# Patient Record
Sex: Female | Born: 1950 | Race: White | Hispanic: No | Marital: Married | Smoking: Former smoker
Health system: Southern US, Community
[De-identification: ages and names within clinical notes are randomized; demographics above are authoritative.]

## PROBLEM LIST (undated history)

## (undated) DIAGNOSIS — K219 Gastro-esophageal reflux disease without esophagitis: Secondary | ICD-10-CM

## (undated) DIAGNOSIS — G43109 Migraine with aura, not intractable, without status migrainosus: Secondary | ICD-10-CM

## (undated) DIAGNOSIS — J45909 Unspecified asthma, uncomplicated: Secondary | ICD-10-CM

## (undated) DIAGNOSIS — M810 Age-related osteoporosis without current pathological fracture: Secondary | ICD-10-CM

## (undated) DIAGNOSIS — M1990 Unspecified osteoarthritis, unspecified site: Secondary | ICD-10-CM

## (undated) DIAGNOSIS — D219 Benign neoplasm of connective and other soft tissue, unspecified: Secondary | ICD-10-CM

## (undated) DIAGNOSIS — M79641 Pain in right hand: Secondary | ICD-10-CM

## (undated) DIAGNOSIS — E559 Vitamin D deficiency, unspecified: Secondary | ICD-10-CM

## (undated) DIAGNOSIS — E785 Hyperlipidemia, unspecified: Secondary | ICD-10-CM

## (undated) DIAGNOSIS — Z87442 Personal history of urinary calculi: Secondary | ICD-10-CM

## (undated) HISTORY — PX: DILATION AND CURETTAGE OF UTERUS: SHX78

## (undated) HISTORY — DX: Unspecified osteoarthritis, unspecified site: M19.90

## (undated) HISTORY — DX: Age-related osteoporosis without current pathological fracture: M81.0

## (undated) HISTORY — DX: Hyperlipidemia, unspecified: E78.5

## (undated) HISTORY — DX: Benign neoplasm of connective and other soft tissue, unspecified: D21.9

## (undated) HISTORY — PX: CATARACT EXTRACTION: SUR2

## (undated) HISTORY — PX: BIOPSY BREAST: PRO8

## (undated) HISTORY — DX: Pain in right hand: M79.641

## (undated) HISTORY — DX: Vitamin D deficiency, unspecified: E55.9

## (undated) HISTORY — PX: OTHER SURGICAL HISTORY: SHX169

## (undated) HISTORY — DX: Gastro-esophageal reflux disease without esophagitis: K21.9

## (undated) HISTORY — DX: Unspecified asthma, uncomplicated: J45.909

## (undated) HISTORY — DX: Migraine with aura, not intractable, without status migrainosus: G43.109

## (undated) HISTORY — DX: Personal history of urinary calculi: Z87.442

---

## 1998-02-26 ENCOUNTER — Other Ambulatory Visit: Admission: RE | Admit: 1998-02-26 | Discharge: 1998-02-26 | Payer: Self-pay | Admitting: Obstetrics and Gynecology

## 1998-03-20 ENCOUNTER — Encounter: Admission: RE | Admit: 1998-03-20 | Discharge: 1998-06-18 | Payer: Self-pay | Admitting: Orthopedic Surgery

## 1998-05-01 ENCOUNTER — Other Ambulatory Visit: Admission: RE | Admit: 1998-05-01 | Discharge: 1998-05-01 | Payer: Self-pay | Admitting: Obstetrics and Gynecology

## 1999-01-27 ENCOUNTER — Emergency Department (HOSPITAL_COMMUNITY): Admission: EM | Admit: 1999-01-27 | Discharge: 1999-01-27 | Payer: Self-pay | Admitting: Endocrinology

## 2000-01-19 ENCOUNTER — Other Ambulatory Visit: Admission: RE | Admit: 2000-01-19 | Discharge: 2000-01-19 | Payer: Self-pay | Admitting: Obstetrics and Gynecology

## 2002-03-09 ENCOUNTER — Other Ambulatory Visit: Admission: RE | Admit: 2002-03-09 | Discharge: 2002-03-09 | Payer: Self-pay | Admitting: Gynecology

## 2004-03-06 ENCOUNTER — Other Ambulatory Visit: Admission: RE | Admit: 2004-03-06 | Discharge: 2004-03-06 | Payer: Self-pay | Admitting: Obstetrics and Gynecology

## 2005-03-09 ENCOUNTER — Other Ambulatory Visit: Admission: RE | Admit: 2005-03-09 | Discharge: 2005-03-09 | Payer: Self-pay | Admitting: Obstetrics and Gynecology

## 2006-04-05 ENCOUNTER — Other Ambulatory Visit: Admission: RE | Admit: 2006-04-05 | Discharge: 2006-04-05 | Payer: Self-pay | Admitting: Obstetrics and Gynecology

## 2006-04-22 ENCOUNTER — Encounter (INDEPENDENT_AMBULATORY_CARE_PROVIDER_SITE_OTHER): Payer: Self-pay | Admitting: *Deleted

## 2006-04-22 ENCOUNTER — Ambulatory Visit (HOSPITAL_BASED_OUTPATIENT_CLINIC_OR_DEPARTMENT_OTHER): Admission: RE | Admit: 2006-04-22 | Discharge: 2006-04-22 | Payer: Self-pay | Admitting: Obstetrics and Gynecology

## 2009-06-24 ENCOUNTER — Other Ambulatory Visit: Admission: RE | Admit: 2009-06-24 | Discharge: 2009-06-24 | Payer: Self-pay | Admitting: Family Medicine

## 2011-03-20 NOTE — Op Note (Signed)
Denise Walsh, Denise Walsh                 ACCOUNT NO.:  [id number]   MEDICAL RECORD NO.:  [id number]          PATIENT TYPE:  AMB   LOCATION:  [facility]                         FACILITY:  [facility]   PHYSICIAN:  Daniel L. Gottsegen, M.D.DATE OF BIRTH:  1951/08/27   DATE OF PROCEDURE:  04/22/2006  DATE OF DISCHARGE:                                 OPERATIVE REPORT   PREOPERATIVE DIAGNOSES:  1.  Postmenopausal bleeding.  2.  Endometrial polyps.   POSTOPERATIVE DIAGNOSES:  1.  Postmenopausal bleeding.  2.  Endometrial polyps.   OPERATIONS:  Hysteroscopy with excision of endometrial polyp.   SURGEON:  Dr. Eda Paschal.   ANESTHESIA:  General endotracheal.   INDICATIONS:  The patient is a 60 year old postmenopausal female not on HRT  who presented to the office with postmenopausal bleeding.  Saline infusion  histogram showed 2 intrauterine cavity defects consistent with two fairly  large endometrial polyps.  She now enters the hospital for hysteroscopy,  excision of endometrial polyps and any other pathology found.   FINDINGS:  External is normal.  BUS is normal.  Vagina is normal.  Cervix is  clean.  Uterus is retroverted, normal size and shape with first-degree  uterine descensus.  Adnexa failed to reveal masses.  At the time  hysteroscopy, the patient had 2 large endometrial polyps, one was coming off  the lower uterine segment on the left side, one was coming off the top of  the fundus; both were between 1-2 cm.  Once they had been removed, the top  of the fundus, tubal ostia, anterior and posterior walls of the fundus,  lower uterine segment, and endocervical canal were free of disease.   PROCEDURE:  After adequate general anesthesia the patient was placed in the  dorsal supine position, and prepped and draped in the usual sterile manner.  A single-tooth tenaculum was placed on the anterior lip of the cervix.  The  cervix was dilated to a #33 Pratt dilator.  The hysteroscopic resectoscope  was  then introduced.  Sorbitol 3% was used to expand the intrauterine  cavity, and the camera was used for magnification.  A 90-degree wire loop  with settings for the coag and cutting that were appropriate was utilized.  Both polyps could be excised in pieces, and all polypoid tissue was sent to  pathology for tissue diagnosis.  There was absolutely no bleeding when the  procedure was terminated.   BLOOD LOSS:  Minimal.   FLUID DEFICIT:  100 mL.   The patient tolerated the procedure well and left the operating in  satisfactory condition.      Daniel L. Eda Paschal, M.D.  Electronically Signed     DLG/MEDQ  D:  04/22/2006  T:  04/22/2006  Job:  [id number]

## 2011-05-28 ENCOUNTER — Other Ambulatory Visit: Payer: Self-pay | Admitting: Family Medicine

## 2011-05-28 ENCOUNTER — Other Ambulatory Visit (HOSPITAL_COMMUNITY)
Admission: RE | Admit: 2011-05-28 | Discharge: 2011-05-28 | Disposition: A | Discharge status: Home / Self Care | Payer: BC Managed Care – PPO | Source: Ambulatory Visit | Attending: Family Medicine | Admitting: Family Medicine

## 2011-05-28 DIAGNOSIS — Z1159 Encounter for screening for other viral diseases: Secondary | ICD-10-CM | POA: Insufficient documentation

## 2011-05-28 DIAGNOSIS — Z124 Encounter for screening for malignant neoplasm of cervix: Secondary | ICD-10-CM | POA: Insufficient documentation

## 2012-07-29 ENCOUNTER — Other Ambulatory Visit: Payer: Self-pay | Admitting: Family Medicine

## 2012-07-29 DIAGNOSIS — R87610 Atypical squamous cells of undetermined significance on cytologic smear of cervix (ASC-US): Secondary | ICD-10-CM | POA: Insufficient documentation

## 2012-08-01 ENCOUNTER — Other Ambulatory Visit (HOSPITAL_COMMUNITY)
Admission: RE | Admit: 2012-08-01 | Discharge: 2012-08-01 | Disposition: A | Discharge status: Home / Self Care | Payer: BC Managed Care – PPO | Source: Ambulatory Visit | Attending: Family Medicine | Admitting: Family Medicine

## 2015-04-26 ENCOUNTER — Other Ambulatory Visit (HOSPITAL_COMMUNITY)
Admission: RE | Admit: 2015-04-26 | Discharge: 2015-04-26 | Disposition: A | Discharge status: Home / Self Care | Payer: BLUE CROSS/BLUE SHIELD | Source: Ambulatory Visit | Attending: Family Medicine | Admitting: Family Medicine

## 2015-04-26 ENCOUNTER — Other Ambulatory Visit: Payer: Self-pay | Admitting: Physician Assistant

## 2015-04-26 DIAGNOSIS — Z124 Encounter for screening for malignant neoplasm of cervix: Secondary | ICD-10-CM | POA: Diagnosis present

## 2015-04-30 LAB — CYTOLOGY - PAP

## 2018-02-16 DIAGNOSIS — Z1231 Encounter for screening mammogram for malignant neoplasm of breast: Secondary | ICD-10-CM | POA: Diagnosis not present

## 2018-02-24 DIAGNOSIS — L658 Other specified nonscarring hair loss: Secondary | ICD-10-CM | POA: Diagnosis not present

## 2018-02-24 DIAGNOSIS — L814 Other melanin hyperpigmentation: Secondary | ICD-10-CM | POA: Diagnosis not present

## 2018-02-24 DIAGNOSIS — D1721 Benign lipomatous neoplasm of skin and subcutaneous tissue of right arm: Secondary | ICD-10-CM | POA: Diagnosis not present

## 2018-02-24 DIAGNOSIS — L650 Telogen effluvium: Secondary | ICD-10-CM | POA: Diagnosis not present

## 2018-04-25 DIAGNOSIS — S83004A Unspecified dislocation of right patella, initial encounter: Secondary | ICD-10-CM | POA: Diagnosis not present

## 2018-05-10 ENCOUNTER — Other Ambulatory Visit: Payer: Self-pay

## 2018-05-10 ENCOUNTER — Ambulatory Visit: Payer: Medicare Other | Attending: Family Medicine | Admitting: Physical Therapy

## 2018-05-10 DIAGNOSIS — M6281 Muscle weakness (generalized): Secondary | ICD-10-CM | POA: Diagnosis not present

## 2018-05-10 DIAGNOSIS — R600 Localized edema: Secondary | ICD-10-CM | POA: Diagnosis not present

## 2018-05-10 NOTE — Therapy (Signed)
[address] [place], [place], [address] Phone: [phone]   Fax:  [phone]  Physical Therapy Evaluation  Patient Details  Name: Denise Walsh MRN: [id number] Date of Birth: 11-25-1950 Referring Provider: Cari Caraway   Encounter Date: 05/10/2018  PT End of Session - 05/10/18 0847    Visit Number  1    Number of Visits  16    Date for PT Re-Evaluation  07/05/18    PT Start Time  0847    PT Stop Time  0930    PT Time Calculation (min)  43 min    Activity Tolerance  Patient tolerated treatment well    Behavior During Therapy  [facility] for tasks assessed/performed       Past Medical History:  Diagnosis Date  . Asthma   . Osteoporosis     History reviewed. No pertinent surgical history.  There were no vitals filed for this visit.   Subjective Assessment - 05/10/18 0855    Subjective  Patient rolled her R ankle when steppiing out of bed and her knee locked in a bent position. R patella had dislocated. Her MD gave her a brace and she is here at PT for strengthening. Now knee clicks occasionally and feels "tricky".     Pertinent History  asthma    Patient Stated Goals  to strengthen    Currently in Pain?  No/denies         [facility] PT Assessment - 05/10/18 0001      Assessment   Medical Diagnosis  R patella dislocation    Referring Provider  Cari Caraway    Onset Date/Surgical Date  04/22/18      Precautions   Required Braces or Orthoses  Other Brace/Splint wears during the day when active      Balance Screen   Has the patient fallen in the past 6 months  No    Has the patient had a decrease in activity level because of a fear of falling?   No    Is the patient reluctant to leave their home because of a fear of falling?   No      Home Film/video editor residence    Living Arrangements  Spouse/significant other    Additional Comments  2 story home no problem with  stairs      Prior Function   Level of Independence  Independent    Vocation  Full time employment    Vocation Requirements  sitting      Observation/Other Assessments   Focus on Therapeutic Outcomes (FOTO)   48%      Functional Tests   Functional tests  Sit to Stand;Squat;Lunges;Single leg stance      Squat   Comments  unable to perform functional squat      Lunges   Comments  able but weak bil      Single Leg Stance   Comments  5 sec on RLE and unstable; L 7 sec more stable      Sit to Stand   Comments  12 sec 5x      ROM / Strength   AROM / PROM / Strength  AROM;Strength      AROM   Overall AROM Comments  R knee 0 to 135 deg;       Strength   Overall Strength Comments  R hip flex/abd/ext 4+/5    Strength Assessment Site  Knee;Ankle  Right/Left Knee  Right;Left    Right Knee Flexion  4/5    Right Knee Extension  4+/5    Left Knee Flexion  4+/5    Left Knee Extension  5/5    Right/Left Ankle  Right;Left    Right Ankle Dorsiflexion  5/5    Right Ankle Plantar Flexion  4+/5    Right Ankle Inversion  5/5    Right Ankle Eversion  5/5      Flexibility   Soft Tissue Assessment /Muscle Length  yes    Hamstrings  Bil HS tightness marked    Quadriceps  bil moderate    ITB  mild R    Piriformis  mild R      Palpation   Patella mobility  hyper mobile bil    Palpation comment  tender in R ITB, lateral quads and HS, R gastroc; R gluteals and piriformis; R medial joint line                Objective measurements completed on examination: See above findings.              PT Education - 05/10/18 0934    Education Details  HEP    Person(s) Educated  Patient    Methods  Explanation;Demonstration;Handout    Comprehension  Returned demonstration;Verbalized understanding       PT Short Term Goals - 05/10/18 0944      PT SHORT TERM GOAL #1   Title  I with inial HEP     Time  2    Period  Weeks    Status  New    Target Date  05/24/18      PT  SHORT TERM GOAL #2   Title  Patient able to drive without discomfort in R knee.    Time  4    Period  Weeks    Status  New    Target Date  06/07/18        PT Long Term Goals - 05/10/18 0946      PT LONG TERM GOAL #1   Title  Patient able to perform functional squats and lunges with full ROM and strength.    Time  8    Period  Weeks    Status  New    Target Date  07/05/18      PT LONG TERM GOAL #2   Title  Patient able to transfer from floor to standing without difficulty.    Time  8    Period  Weeks    Status  New      PT LONG TERM GOAL #3   Title  Improved SLS time to 10 sec on the right to improve balance.    Time  8    Period  Weeks    Status  New      PT LONG TERM GOAL #4   Title  Patient to report no incidence of disclocation in R knee.    Time  8    Period  Weeks    Status  New      PT LONG TERM GOAL #5   Title  patient to demo 5/5 R knee and hip strength to improve function.    Time  8    Period  Weeks    Status  New             Plan - 05/10/18 0934    Clinical Impression Statement  Patient presents for low complexity  evaluation s/p R patella dislocation. She has functional weakness in bil knees, hips and ankles, flexibility deficits and edema along her R Medial joint line. She has some discomfort with palpation at MJL. She reports difficulty with sit to stand transfers and driving 15 min or more and also has difficulty with SLS. She will benefit from PT to address these deficits.    Clinical Presentation  Stable    Clinical Decision Making  Low    Rehab Potential  Excellent    PT Frequency  2x / week    PT Duration  8 weeks    PT Treatment/Interventions  ADLs/Self Care Home Management;Iontophoresis 4mg /ml Dexamethasone;Electrical Stimulation;Cryotherapy;Ultrasound;Therapeutic exercise;Balance training;Neuromuscular re-education;Patient/family education;Manual techniques;Dry needling;Taping;Vasopneumatic Device    PT Next Visit Plan  LE strengthening;  functional activities including squats/lunges; balance/SLS    PT Home Exercise Plan  stretches: quads, ITB, HS, piriformis    Consulted and Agree with Plan of Care  Patient       Patient will benefit from skilled therapeutic intervention in order to improve the following deficits and impairments:  Decreased strength, Decreased balance, Impaired flexibility, Pain  Visit Diagnosis: Muscle weakness (generalized) - Plan: PT plan of care cert/re-cert  Localized edema - Plan: PT plan of care cert/re-cert     Problem List There are no active problems to display for this patient.   Denise Walsh PT 05/10/2018, 9:56 AM  [address] [place], [place], [address] Phone: [phone]   Fax:  [phone]  Name: Denise Walsh MRN: [id number] Date of Birth: 08-Aug-1951

## 2018-05-10 NOTE — Patient Instructions (Signed)
  Hamstring Stretch, Reclined (Strap, Doorframe)   Lengthen bottom leg on floor. Extend top leg along edge of doorframe or press foot up into yoga strap. Hold for 30-60  seconds. Repeat 3_ times each leg.  Piriformis (Supine)  Cross legs, right on top. Gently pull other knee toward chest until stretch is felt in buttock/hip of top leg. Hold _30___ seconds. Repeat _3___ times per set. Do ____ sets per session. Do __2__ sessions per day.   Quadriceps (Prone)   On stomach with sheet around ankles, knees together, hips down, pull heels toward bottom. Keep hips flat. Hold __30__ seconds. Repeat _3__ times. Do __3__ sessions per day. CAUTION: Stretch should be gentle, steady and slow.  Copyright  VHI. All rights reserved.    Outer Hip Stretch: Reclined IT Band Stretch (Strap)   Strap around opposite foot, pull across only as far as possible with shoulders on mat. Hold for __30__ seconds. Repeat __3__ times each leg. 2-3 x/day.  Copyright  VHI. All rights reserved.    Madelyn Flavors, PT 05/10/18 9:29 AM; [address] [place], [place], [address] Phone: [phone]   Fax:  [phone]

## 2018-05-12 ENCOUNTER — Encounter: Payer: Self-pay | Admitting: Physical Therapy

## 2018-05-12 ENCOUNTER — Ambulatory Visit: Payer: Medicare Other | Admitting: Physical Therapy

## 2018-05-12 DIAGNOSIS — R600 Localized edema: Secondary | ICD-10-CM | POA: Diagnosis not present

## 2018-05-12 DIAGNOSIS — M6281 Muscle weakness (generalized): Secondary | ICD-10-CM

## 2018-05-12 NOTE — Therapy (Signed)
[address] [place], [place], [address] Phone: [phone]   Fax:  [phone]  Physical Therapy Treatment  Patient Details  Name: Denise Walsh MRN: [id number] Date of Birth: 11-07-1950 Referring Provider: Cari Caraway   Encounter Date: 05/12/2018  PT End of Session - 05/12/18 1055    Visit Number  2    Date for PT Re-Evaluation  07/05/18    PT Start Time  1014    PT Stop Time  1104    PT Time Calculation (min)  50 min    Activity Tolerance  Patient tolerated treatment well    Behavior During Therapy  [facility] for tasks assessed/performed       Past Medical History:  Diagnosis Date  . Asthma   . Osteoporosis     History reviewed. No pertinent surgical history.  There were no vitals filed for this visit.  Subjective Assessment - 05/12/18 1016    Subjective  "Been doing fine, been doing the stretches"    Currently in Pain?  No/denies                       [facility] Adult PT Treatment/Exercise - 05/12/18 0001      Exercises   Exercises  Knee/Hip      Knee/Hip Exercises: Stretches   Passive Hamstring Stretch  Both;4 reps;10 seconds    Gastroc Stretch  Both;4 reps;20 seconds      Knee/Hip Exercises: Aerobic   Nustep  L4 x6 min       Knee/Hip Exercises: Standing   Forward Step Up  Right;1 set;10 reps;Hand Hold: 1;Step Height: 6"      Knee/Hip Exercises: Seated   Long Arc Quad  2 sets;10 reps;Weights    Long Arc Quad Weight  2 lbs.    Sit to Sand  2 sets;10 reps;without UE support 2nd set with ball squeeze      Knee/Hip Exercises: Supine   Short Arc Quad Sets  Right;2 sets;10 reps    Short Arc Quad Sets Limitations  2lb    Bridges with [employer]  Both;1 set;15 reps    Straight Leg Raises  1 set;Right;10 reps    Straight Leg Raise with External Rotation  Right;1 set;10 reps      Modalities   Modalities  Cryotherapy      Cryotherapy   Number Minutes Cryotherapy  10  Minutes    Cryotherapy Location  Knee    Type of Cryotherapy  Ice pack               PT Short Term Goals - 05/12/18 1058      PT SHORT TERM GOAL #1   Title  I with inial HEP     Status  Achieved      PT SHORT TERM GOAL #2   Title  Patient able to drive without discomfort in R knee.    Status  Partially Met        PT Long Term Goals - 05/10/18 0946      PT LONG TERM GOAL #1   Title  Patient able to perform functional squats and lunges with full ROM and strength.    Time  8    Period  Weeks    Status  New    Target Date  07/05/18      PT LONG TERM GOAL #2   Title  Patient able to transfer from floor to  standing without difficulty.    Time  8    Period  Weeks    Status  New      PT LONG TERM GOAL #3   Title  Improved SLS time to 10 sec on the right to improve balance.    Time  8    Period  Weeks    Status  New      PT LONG TERM GOAL #4   Title  Patient to report no incidence of disclocation in R knee.    Time  8    Period  Weeks    Status  New      PT LONG TERM GOAL #5   Title  patient to demo 5/5 R knee and hip strength to improve function.    Time  8    Period  Weeks    Status  New            Plan - 05/12/18 1056    Clinical Impression Statement  STG met. Patient tolerated an initial progression to TE well. She does report some clinking with SAQ. Pt reports no recent issues. Fatigues quick with bridging interventions. Tight Bilat HS and gast roc noted with stretching. Cues to push down through RLE with step ups.     Rehab Potential  Excellent    PT Frequency  2x / week    PT Duration  8 weeks    PT Treatment/Interventions  ADLs/Self Care Home Management;Iontophoresis 4mg/ml Dexamethasone;Electrical Stimulation;Cryotherapy;Ultrasound;Therapeutic exercise;Balance training;Neuromuscular re-education;Patient/family education;Manual techniques;Dry needling;Taping;Vasopneumatic Device    PT Next Visit Plan  LE strengthening; functional activities  including squats/lunges; balance/SLS       Patient will benefit from skilled therapeutic intervention in order to improve the following deficits and impairments:  Decreased strength, Decreased balance, Impaired flexibility, Pain  Visit Diagnosis: Muscle weakness (generalized)  Localized edema     Problem List There are no active problems to display for this patient.   Ronald G Pemberton 05/12/2018, 10:59 AM  [facility] [address] , [place], [address] Phone: [phone]   Fax:  [phone]  Name: Cheryel Kelly Pollina MRN: [id number] Date of Birth: 11/23/1950   

## 2018-05-17 ENCOUNTER — Ambulatory Visit: Payer: Medicare Other | Admitting: Physical Therapy

## 2018-05-17 DIAGNOSIS — R600 Localized edema: Secondary | ICD-10-CM

## 2018-05-17 DIAGNOSIS — M6281 Muscle weakness (generalized): Secondary | ICD-10-CM

## 2018-05-17 NOTE — Therapy (Signed)
[address] [place], [place], [address] Phone: [phone]   Fax:  [phone]  Physical Therapy Treatment  Patient Details  Name: Eline Geng MRN: [id number] Date of Birth: 23-Nov-1950 Referring Provider: Cari Caraway   Encounter Date: 05/17/2018  PT End of Session - 05/17/18 1231    Visit Number  3    Number of Visits  16    Date for PT Re-Evaluation  07/05/18    PT Start Time  0300    PT Stop Time  1244    PT Time Calculation (min)  59 min    Activity Tolerance  Patient tolerated treatment well    Behavior During Therapy  [facility] for tasks assessed/performed       Past Medical History:  Diagnosis Date  . Asthma   . Osteoporosis     No past surgical history on file.  There were no vitals filed for this visit.  Subjective Assessment - 05/17/18 1149    Subjective  "Feeling good. Sometimes I can't tell if it's any better or worse than when it happened."    Currently in Pain?  No/denies                       [facility] Adult PT Treatment/Exercise - 05/17/18 0001      Neuro Re-ed    Neuro Re-ed Details   [place] Stim to VMO, short arc quad, 10 minutes, 4/12, 22 mA CC, 50%, 2 sec ramp      Knee/Hip Exercises: Stretches   Passive Hamstring Stretch  Both;4 reps;10 seconds    ITB Stretch  Right;30 seconds    Gastroc Stretch  Both;4 reps;20 seconds      Knee/Hip Exercises: Aerobic   Nustep  L4 x6 min       Knee/Hip Exercises: Standing   Forward Step Up  Right;1 set;10 reps;Step Height: 6";Hand Hold: 0      Knee/Hip Exercises: Seated   Sit to Sand  2 sets;10 reps;without UE support ball squeeze both sets      Knee/Hip Exercises: Supine   Terminal Knee Extension  AROM;Right;2 sets;10 reps;Theraband red    Bridges with [employer]  Both;1 set;15 reps    Straight Leg Raises  1 set;Right;10 reps    Straight Leg Raise with External Rotation  Right;1 set;10 reps      Modalities   Modalities  Cryotherapy      Cryotherapy   Number Minutes Cryotherapy  10 Minutes    Cryotherapy Location  Knee    Type of Cryotherapy  Ice pack             PT Education - 05/17/18 1230    Education Details  Taught a 1/2 kneeling quad stretch on airex pad, because pt stated she was having difficulty performing HEP prone quad stretch.    Person(s) Educated  Patient    Methods  Explanation;Demonstration;Tactile cues;Verbal cues    Comprehension  Verbalized understanding;Returned demonstration       PT Short Term Goals - 05/12/18 1058      PT SHORT TERM GOAL #1   Title  I with inial HEP     Status  Achieved      PT SHORT TERM GOAL #2   Title  Patient able to drive without discomfort in R knee.    Status  Partially Met        PT Long Term Goals - 05/10/18 9233  PT LONG TERM GOAL #1   Title  Patient able to perform functional squats and lunges with full ROM and strength.    Time  8    Period  Weeks    Status  New    Target Date  07/05/18      PT LONG TERM GOAL #2   Title  Patient able to transfer from floor to standing without difficulty.    Time  8    Period  Weeks    Status  New      PT LONG TERM GOAL #3   Title  Improved SLS time to 10 sec on the right to improve balance.    Time  8    Period  Weeks    Status  New      PT LONG TERM GOAL #4   Title  Patient to report no incidence of disclocation in R knee.    Time  8    Period  Weeks    Status  New      PT LONG TERM GOAL #5   Title  patient to demo 5/5 R knee and hip strength to improve function.    Time  8    Period  Weeks    Status  New            Plan - 05/17/18 1237    Clinical Impression Statement  Pt tolerated treatment progression well. She reported a "good feeling" in the quad/VMO during TKE. Pt also reported feeling a good senstation in the quad during and after [place] stim short arc quads. Pt noted that she can tell the posterior leg is tight. Pt required verbal and tactile  cueing to engage adductors during LE activities.     PT Frequency  2x / week    PT Duration  8 weeks    PT Treatment/Interventions  ADLs/Self Care Home Management;Iontophoresis 71m/ml Dexamethasone;Electrical Stimulation;Cryotherapy;Ultrasound;Therapeutic exercise;Balance training;Neuromuscular re-education;Patient/family education;Manual techniques;Dry needling;Taping;Vasopneumatic Device    PT Next Visit Plan  LE strengthening; functional activities including squats/lunges; balance/SLS    PT Home Exercise Plan  stretches: quads, ITB, HS, piriformism, gave modified 1/2 KN quad stretch     Consulted and Agree with Plan of Care  Patient       Patient will benefit from skilled therapeutic intervention in order to improve the following deficits and impairments:  Decreased strength, Decreased balance, Impaired flexibility, Pain  Visit Diagnosis: Muscle weakness (generalized)  Localized edema     Problem List There are no active problems to display for this patient.   SMaryelizabeth Kaufmann SPTA 05/17/2018, 12:43 PM  [address] [address]Phone: [phone]  Fax:  [phone] Name: MDarcel ZickMRN: [id number] of Birth: 4Dec 16, 1952

## 2018-05-19 ENCOUNTER — Ambulatory Visit: Payer: Medicare Other | Admitting: Physical Therapy

## 2018-05-19 DIAGNOSIS — M6281 Muscle weakness (generalized): Secondary | ICD-10-CM

## 2018-05-19 DIAGNOSIS — R600 Localized edema: Secondary | ICD-10-CM

## 2018-05-19 NOTE — Therapy (Signed)
[place] [address] [place], [place], [address] Phone: [phone]   Fax:  [phone]  Physical Therapy Treatment  Patient Details  Name: Denise Walsh MRN: [id number] Date of Birth: October 21, 1951 Referring Provider: Cari Caraway   Encounter Date: 05/19/2018  PT End of Session - 05/19/18 1150    Visit Number  4    Number of Visits  16    Date for PT Re-Evaluation  07/05/18    PT Start Time  1103    PT Stop Time  1201    PT Time Calculation (min)  58 min    Activity Tolerance  Patient tolerated treatment well    Behavior During Therapy  [facility] for tasks assessed/performed       Past Medical History:  Diagnosis Date  . Asthma   . Osteoporosis     No past surgical history on file.  There were no vitals filed for this visit.  Subjective Assessment - 05/19/18 1104    Subjective  Pt reports that she's feeling fine and feels like she is favoring the leg a little less.     Currently in Pain?  No/denies                       [facility] Adult PT Treatment/Exercise - 05/19/18 0001      Neuro Re-ed    Neuro Re-ed Details   [place] Stim to VMO, short arc quad, 8 minutes, 10/10, 22 mA CC, 50%, 2 sec ramp Ball squeeze with bilateral SAQ      Knee/Hip Exercises: Stretches   Passive Hamstring Stretch  Both;4 reps;10 seconds    ITB Stretch  Right;30 seconds      Knee/Hip Exercises: Aerobic   Recumbent Bike  L1 x6 min      Knee/Hip Exercises: Standing   Forward Step Up  Right;10 reps;Step Height: 6";Hand Hold: 0;2 sets      Knee/Hip Exercises: Seated   Sit to Sand  2 sets;10 reps;without UE support      Knee/Hip Exercises: Supine   Terminal Knee Extension  AROM;Right;2 sets;10 reps;Theraband    Bridges with [employer]  Both;15 reps;2 sets    Straight Leg Raises  1 set;Right;10 reps    Straight Leg Raise with External Rotation  Right;1 set;10 reps      Modalities   Modalities  Cryotherapy       Cryotherapy   Number Minutes Cryotherapy  10 Minutes    Cryotherapy Location  Knee    Type of Cryotherapy  Ice pack               PT Short Term Goals - 05/12/18 1058      PT SHORT TERM GOAL #1   Title  I with inial HEP     Status  Achieved      PT SHORT TERM GOAL #2   Title  Patient able to drive without discomfort in R knee.    Status  Partially Met        PT Long Term Goals - 05/19/18 1234      PT LONG TERM GOAL #1   Title  Patient able to perform functional squats and lunges with full ROM and strength.    Time  8    Period  Weeks    Status  On-going      PT LONG TERM GOAL #2   Title  Patient able to transfer from floor  to standing without difficulty.    Time  8    Period  Weeks    Status  On-going      PT LONG TERM GOAL #4   Title  Patient to report no incidence of disclocation in R knee.    Time  8    Period  Weeks    Status  Partially Met            Plan - 05/19/18 1151    Clinical Impression Statement  Pt tolerated treatment well. Pt required verbal and tactile cueing to keep the knee tracking correctly during step ups with added band resistance. Pt reported being fatigued from extra workoad previous session, but that it went away after that day. Pt reported still feeling "uncertain" with her knee and the sounds it makes during activity. Pt does well with external cues/objects to engage the medial Quad and Adductors.     PT Frequency  2x / week    PT Duration  8 weeks    PT Treatment/Interventions  ADLs/Self Care Home Management;Iontophoresis 59m/ml Dexamethasone;Electrical Stimulation;Cryotherapy;Ultrasound;Therapeutic exercise;Balance training;Neuromuscular re-education;Patient/family education;Manual techniques;Dry needling;Taping;Vasopneumatic Device    PT Next Visit Plan  LE strengthening; functional activities including squats/lunges; balance/SLS    PT Home Exercise Plan  stretches: quads, ITB, HS, piriformism, gave modified 1/2 KN quad  stretch        Patient will benefit from skilled therapeutic intervention in order to improve the following deficits and impairments:  Decreased strength, Decreased balance, Impaired flexibility, Pain  Visit Diagnosis: Muscle weakness (generalized)  Localized edema     Problem List There are no active problems to display for this patient.  SCotulla SHiggins7/18/2019, 12:39 PM  [address] [address]Phone: [phone]  Fax:  [phone] Name: MTilla WilbornMRN: [id number] of Birth: 407/29/1952   JMadelyn Flavors PT 05/19/18 12:44 PM [address] [address]Phone: [phone]  Fax:  [phone]

## 2018-05-24 ENCOUNTER — Ambulatory Visit: Payer: Medicare Other | Admitting: Physical Therapy

## 2018-05-24 DIAGNOSIS — R600 Localized edema: Secondary | ICD-10-CM

## 2018-05-24 DIAGNOSIS — M6281 Muscle weakness (generalized): Secondary | ICD-10-CM | POA: Diagnosis not present

## 2018-05-24 NOTE — Therapy (Signed)
[place] [address] [place], [place], [address] Phone: [phone]   Fax:  [phone]  Physical Therapy Treatment  Patient Details  Name: Denise Walsh MRN: [id number] Date of Birth: 10-09-1951 Referring Provider: Cari Caraway   Encounter Date: 05/24/2018  PT End of Session - 05/24/18 0814    Visit Number  5    Number of Visits  16    Date for PT Re-Evaluation  07/05/18    PT Start Time  0801    PT Stop Time  0852    PT Time Calculation (min)  51 min       Past Medical History:  Diagnosis Date  . Asthma   . Osteoporosis     No past surgical history on file.  There were no vitals filed for this visit.  Subjective Assessment - 05/24/18 0802    Subjective  Pt reports feeling fine and that she felt good after last treatment. Knee aches randomly but nothing in particular makes it feel that way.     Currently in Pain?  No/denies                       [facility] Adult PT Treatment/Exercise - 05/24/18 0001      Neuro Re-ed    Neuro Re-ed Details   [place] Stim to VMO, short arc quad, 8 minutes, 4/12, 22 mA CC, 50%, 2 sec ramp      Knee/Hip Exercises: Stretches   Passive Hamstring Stretch  Both;4 reps;10 seconds    ITB Stretch  Right;30 seconds    Piriformis Stretch  Right;2 reps;30 seconds      Knee/Hip Exercises: Aerobic   Nustep  L4 x6 min       Knee/Hip Exercises: Machines for Strengthening   Cybex Leg Press  20lb. 2x10      Knee/Hip Exercises: Standing   Forward Step Up  Right;10 reps;Step Height: 6";Hand Hold: 0;2 sets      Knee/Hip Exercises: Seated   Sit to Sand  2 sets;10 reps;without UE support ball squeeze      Knee/Hip Exercises: Supine   Terminal Knee Extension  AROM;Right;2 sets;10 reps;Theraband    Bridges with [employer]  Both;15 reps;2 sets    Straight Leg Raises  1 set;Right;10 reps    Straight Leg Raise with External Rotation  Right;1 set;10 reps      Modalities   Modalities  Cryotherapy      Cryotherapy   Number Minutes Cryotherapy  10 Minutes    Cryotherapy Location  Knee    Type of Cryotherapy  Ice pack               PT Short Term Goals - 05/12/18 1058      PT SHORT TERM GOAL #1   Title  I with inial HEP     Status  Achieved      PT SHORT TERM GOAL #2   Title  Patient able to drive without discomfort in R knee.    Status  Partially Met        PT Long Term Goals - 05/19/18 1234      PT LONG TERM GOAL #1   Title  Patient able to perform functional squats and lunges with full ROM and strength.    Time  8    Period  Weeks    Status  On-going      PT LONG TERM GOAL #2  Title  Patient able to transfer from floor to standing without difficulty.    Time  8    Period  Weeks    Status  On-going      PT LONG TERM GOAL #4   Title  Patient to report no incidence of disclocation in R knee.    Time  8    Period  Weeks    Status  Partially Met            Plan - 05/24/18 0815    Clinical Impression Statement  Pt reported feeling good when she came in and that previous treatment left her feeling better. Pt tolerated an increase in strength exercises and reported it felt fine. Pt required verbal and tactile cues to keep the knees in alignment during leg press and step ups. Pt reported that she felt great being able to do the leg press. Pt required constant verbal and tactile cues to fully engage LLE during Media.     Rehab Potential  Excellent    PT Frequency  2x / week    PT Duration  8 weeks    PT Treatment/Interventions  ADLs/Self Care Home Management;Iontophoresis 51m/ml Dexamethasone;Electrical Stimulation;Cryotherapy;Ultrasound;Therapeutic exercise;Balance training;Neuromuscular re-education;Patient/family education;Manual techniques;Dry needling;Taping;Vasopneumatic Device    PT Next Visit Plan  LE strengthening; functional activities including squats/lunges; balance/SLS    PT Home Exercise Plan   stretches: quads, ITB, HS, piriformism, gave modified 1/2 KN quad stretch        Patient will benefit from skilled therapeutic intervention in order to improve the following deficits and impairments:  Decreased strength, Decreased balance, Impaired flexibility, Pain  Visit Diagnosis: Muscle weakness (generalized)  Localized edema     Problem List There are no active problems to display for this patient.   SMaryelizabeth Walsh SPTA 05/24/2018, 8:42 AM  CAnchorage[address]way[address] [address]Phone: [phone]  Fax:  [phone] Name: MMigdalia OlejniczakMRN: [id number] of Birth: [birth year]

## 2018-05-25 ENCOUNTER — Other Ambulatory Visit (HOSPITAL_COMMUNITY)
Admission: RE | Admit: 2018-05-25 | Discharge: 2018-05-25 | Disposition: A | Discharge status: Home / Self Care | Payer: Medicare Other | Source: Ambulatory Visit | Attending: Family Medicine | Admitting: Family Medicine

## 2018-05-25 ENCOUNTER — Other Ambulatory Visit: Payer: Self-pay | Admitting: Family Medicine

## 2018-05-25 DIAGNOSIS — Z1211 Encounter for screening for malignant neoplasm of colon: Secondary | ICD-10-CM | POA: Diagnosis not present

## 2018-05-25 DIAGNOSIS — Z01419 Encounter for gynecological examination (general) (routine) without abnormal findings: Secondary | ICD-10-CM | POA: Diagnosis not present

## 2018-05-25 DIAGNOSIS — Z23 Encounter for immunization: Secondary | ICD-10-CM | POA: Diagnosis not present

## 2018-05-25 DIAGNOSIS — Z1159 Encounter for screening for other viral diseases: Secondary | ICD-10-CM | POA: Diagnosis not present

## 2018-05-25 DIAGNOSIS — Z0000 Encounter for general adult medical examination without abnormal findings: Secondary | ICD-10-CM | POA: Diagnosis not present

## 2018-05-25 DIAGNOSIS — J309 Allergic rhinitis, unspecified: Secondary | ICD-10-CM | POA: Diagnosis not present

## 2018-05-25 DIAGNOSIS — J4530 Mild persistent asthma, uncomplicated: Secondary | ICD-10-CM | POA: Diagnosis not present

## 2018-05-25 DIAGNOSIS — M810 Age-related osteoporosis without current pathological fracture: Secondary | ICD-10-CM | POA: Diagnosis not present

## 2018-05-25 DIAGNOSIS — Z124 Encounter for screening for malignant neoplasm of cervix: Secondary | ICD-10-CM | POA: Diagnosis not present

## 2018-05-26 ENCOUNTER — Ambulatory Visit: Payer: Medicare Other | Admitting: Physical Therapy

## 2018-05-26 DIAGNOSIS — R600 Localized edema: Secondary | ICD-10-CM

## 2018-05-26 DIAGNOSIS — M6281 Muscle weakness (generalized): Secondary | ICD-10-CM

## 2018-05-26 NOTE — Therapy (Signed)
[address] [place], [place], [address] Phone: [phone]   Fax:  [phone]  Physical Therapy Treatment  Patient Details  Name: Denise Walsh MRN: [id number] Date of Birth: [birth year] Referring Provider: Cari Caraway   Encounter Date: 05/26/2018  PT End of Session - 05/26/18 0806    Visit Number  6    Number of Visits  16    Date for PT Re-Evaluation  07/05/18    PT Start Time  0800    PT Stop Time  0901    PT Time Calculation (min)  61 min       Past Medical History:  Diagnosis Date  . Asthma   . Osteoporosis     No past surgical history on file.  There were no vitals filed for this visit.  Subjective Assessment - 05/26/18 0801    Subjective  Pt reports feeling alright and fine.     Currently in Pain?  No/denies                       [facility] Adult PT Treatment/Exercise - 05/26/18 0001      Transfers   Transfers  Floor to Transfer    Floor to Transfer  6: Modified independent (Device/Increase time)    Comments  tall kneeling to lunge to tabel top HHA first couple reps then zero HHA      Knee/Hip Exercises: Stretches   Passive Hamstring Stretch  Both;10 seconds;2 reps    ITB Stretch  Right;30 seconds    Piriformis Stretch  Right;2 reps;30 seconds      Knee/Hip Exercises: Aerobic   Recumbent Bike  L1 x 6 min      Knee/Hip Exercises: Machines for Strengthening   Cybex Leg Press  30lb. 3x10      Knee/Hip Exercises: Standing   Forward Lunges  Both;2 sets;5 reps HHA x 1    Forward Step Up  Right;10 reps;Step Height: 6";Hand Hold: 0;2 sets    Functional Squat  2 sets;10 reps Cable stack TRX style assisted squat    Other Standing Knee Exercises  Heel Raises 2 x 10      Knee/Hip Exercises: Supine   Bridges with Ball Squeeze  Both;15 reps;2 sets    Straight Leg Raises  Right;2 sets;10 reps 2lb.     Straight Leg Raise with External Rotation  Right;2 sets;10 reps 2 lb.       Modalities   Modalities  Vasopneumatic      Vasopneumatic   Number Minutes Vasopneumatic   15 minutes    Vasopnuematic Location   Knee    Vasopneumatic Pressure  Medium    Vasopneumatic Temperature   32               PT Short Term Goals - 05/26/18 0856      PT SHORT TERM GOAL #2   Status  Partially Met        PT Long Term Goals - 05/26/18 0856      PT LONG TERM GOAL #1   Status  Partially Met      PT LONG TERM GOAL #2   Status  Partially Met            Plan - 05/26/18 0852    Clinical Impression Statement  Pt tolerated an increase in volume and intensity on LE resisted exercise. Pt was able to complete functional squat and lunge with verbal cueing  to load foot in a balanced weight distribution to avoid excessive stress on the patella. Pt was able to complete transfers from floor to table with slight difficulty and eventual pain in the patella region after being on knees for too long.     Rehab Potential  Excellent    PT Frequency  2x / week    PT Duration  8 weeks    PT Treatment/Interventions  ADLs/Self Care Home Management;Iontophoresis 34m/ml Dexamethasone;Electrical Stimulation;Cryotherapy;Ultrasound;Therapeutic exercise;Balance training;Neuromuscular re-education;Patient/family education;Manual techniques;Dry needling;Taping;Vasopneumatic Device    PT Next Visit Plan  LE strengthening; functional activities including squats/lunges; balance/SLS, transfers       Patient will benefit from skilled therapeutic intervention in order to improve the following deficits and impairments:  Decreased strength, Decreased balance, Impaired flexibility, Pain  Visit Diagnosis: Muscle weakness (generalized)  Localized edema     Problem List There are no active problems to display for this patient.   SMaryelizabeth Walsh SPTA 05/26/2018, 8:57 AM  [address]atesBZapata RanchSuite [address]  [address]Phone: [phone]  Fax:  [phone] Name: Denise InscoeMRN: [id number] of Birth: 407/30/1952

## 2018-05-26 NOTE — Therapy (Signed)
[address] [place], [place], [address] Phone: [phone]   Fax:  [phone]  Patient Details  Name: Denise Walsh MRN: [id number] Date of Birth: 12-27-1950 Referring Provider:  Cari Caraway, MD  Encounter Date: 05/26/2018   Laqueta Carina 05/26/2018, 9:03 AM  [address] [place], [place], [address] Phone: [phone]   Fax:  [phone]

## 2018-05-27 DIAGNOSIS — Z1211 Encounter for screening for malignant neoplasm of colon: Secondary | ICD-10-CM | POA: Diagnosis not present

## 2018-05-27 LAB — CYTOLOGY - PAP: Diagnosis: NEGATIVE

## 2018-06-01 ENCOUNTER — Ambulatory Visit: Payer: Medicare Other | Admitting: Physical Therapy

## 2018-06-01 DIAGNOSIS — M6281 Muscle weakness (generalized): Secondary | ICD-10-CM

## 2018-06-01 DIAGNOSIS — R600 Localized edema: Secondary | ICD-10-CM

## 2018-06-01 NOTE — Therapy (Signed)
Denise Walsh, [place], [address] Phone: [phone]   Fax:  [phone]  Physical Therapy Treatment  Patient Details  Name: Denise Walsh MRN: [id number] Date of Birth: [birth year] Referring Provider: Cari Walsh   Encounter Date: 06/01/2018  PT End of Session - 06/01/18 1428    Visit Number  7    Number of Visits  16    Date for PT Re-Evaluation  07/05/18    PT Start Time  5284    PT Stop Time  1428    PT Time Calculation (min)  43 min    Activity Tolerance  Patient tolerated treatment well    Behavior During Therapy  [facility] for tasks assessed/performed       Past Medical History:  Diagnosis Date  . Asthma   . Osteoporosis     No past surgical history on file.  There were no vitals filed for this visit.  Subjective Assessment - 06/01/18 1353    Subjective  Pt reports having a good day and being busy getting ready to leave for a trip.     Currently in Pain?  No/denies                       [facility] Adult PT Treatment/Exercise - 06/01/18 0001      Knee/Hip Exercises: Aerobic   Recumbent Bike  L1 x 6 min      Knee/Hip Exercises: Machines for Strengthening   Cybex Knee Extension  10lb. 2x10    Cybex Knee Flexion  20lb 2x10    Cybex Leg Press  30lb. 3x10      Knee/Hip Exercises: Standing   Forward Lunges  Both;2 sets;5 reps HHAx1    Lateral Step Up  Right;1 set;10 reps;Hand Hold: 0    Forward Step Up  Right;10 reps;Step Height: 6";Hand Hold: 0;2 sets    Functional Squat  2 sets;10 reps    Other Standing Knee Exercises  Heel Raises 2 x 10               PT Short Term Goals - 05/26/18 0856      PT SHORT TERM GOAL #2   Status  Partially Met        PT Long Term Goals - 05/26/18 0856      PT LONG TERM GOAL #1   Title  Patient able to perform functional squats and lunges with full ROM and strength.    Status  Partially Met      PT LONG TERM GOAL #2   Title  Patient able to transfer from floor to standing without difficulty.    Status  Partially Met      PT LONG TERM GOAL #3   Title  Improved SLS time to 10 sec on the right to improve balance.    Status  On-going      PT LONG TERM GOAL #4   Title  Patient to report no incidence of disclocation in R knee.    Status  Partially Met            Plan - 06/01/18 1430    Clinical Impression Statement  Pt tolerated treatment well. Pt stated she feels much more comfortable and less fearful with activity and exercise. Pt going to beach this weekend and SPTA instructed her to continue stretches she was given previously during vacation. Pt requires verbal cueing to slow down and control the eccentirc  portion of step ups to reduce clicking of the patella. Pt reported sore after last session but "in a good way." Pt tolerated an increase in resisted exercise volume and intensity. Pt stated she was confortable not doing ice and would do later at home.    Rehab Potential  Excellent    PT Frequency  2x / week    PT Duration  8 weeks    PT Treatment/Interventions  ADLs/Self Care Home Management;Iontophoresis 36m/ml Dexamethasone;Electrical Stimulation;Cryotherapy;Ultrasound;Therapeutic exercise;Balance training;Neuromuscular re-education;Patient/family education;Manual techniques;Dry needling;Taping;Vasopneumatic Device    PT Next Visit Plan  LE strengthening; functional activities including squats/lunges; balance/SLS, transfers    PT Home Exercise Plan  stretches: quads, ITB, HS, piriformism, gave modified 1/2 KN quad stretch        Patient will benefit from skilled therapeutic intervention in order to improve the following deficits and impairments:  Decreased strength, Decreased balance, Impaired flexibility, Pain  Visit Diagnosis: Muscle weakness (generalized)  Localized edema     Problem List There are no active problems to display for this patient.   SMaryelizabeth Walsh SPTA 06/01/2018,  2:44 PM  [address] [place] [address]Phone: [phone]  Fax:  [phone] Name: MGoldy CalandraMRN: [id number] of Birth: 407-12-1950

## 2018-06-03 ENCOUNTER — Ambulatory Visit: Payer: Medicare Other | Admitting: Physical Therapy

## 2018-06-13 ENCOUNTER — Encounter: Payer: Self-pay | Admitting: Physical Therapy

## 2018-06-13 ENCOUNTER — Ambulatory Visit: Payer: Medicare Other | Attending: Family Medicine | Admitting: Physical Therapy

## 2018-06-13 DIAGNOSIS — M6281 Muscle weakness (generalized): Secondary | ICD-10-CM | POA: Insufficient documentation

## 2018-06-13 NOTE — Therapy (Signed)
[place] [address] [place], [place], [address] Phone: [phone]   Fax:  [phone]  Physical Therapy Treatment  Patient Details  Name: Denise Walsh MRN: [id number] Date of Birth: 1951/08/09 Referring Provider: Cari Caraway   Encounter Date: 06/13/2018  PT End of Session - 06/13/18 0858    Visit Number  8    Date for PT Re-Evaluation  07/05/18    PT Start Time  0800    PT Stop Time  0855    PT Time Calculation (min)  55 min    Activity Tolerance  Patient tolerated treatment well    Behavior During Therapy  [facility] for tasks assessed/performed       Past Medical History:  Diagnosis Date  . Asthma   . Osteoporosis     History reviewed. No pertinent surgical history.  There were no vitals filed for this visit.  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  No/denies                       [facility] Adult PT Treatment/Exercise - 06/13/18 0001      Ambulation/Gait   Gait Comments  gait around building safety instruction for uneven surfaces, talked about walking programs      Self-Care   Self-Care  ADL's;Lifting;Other Self-Care Comments    ADL's  worked on how to get up from floor to decrease stress on the knee    Lifting  worked with her on body mechanics for lifting and how to decrease stress on the knee    Other Self-Care Comments   went over an advanced HEP for VMO, hip, legs and UE and core strength to helpw ith her overall fitness, she had many reasons that she does not exercises but also meany reasons why she should, went over how to make it a point to care for yourself and take time for yourself to be healthy and not have the knee issue again, went over equipment in the gym and how to do at home, walking programs, form, weight reps etcc   Patient was able  to demo HEP with some cues to slow down for 4" step ups to focus on eccentrics      Knee/Hip Exercises: Aerobic   Nustep  L4 x6 min                PT Short Term Goals - 06/13/18 0903      PT SHORT TERM GOAL #2   Title  Patient able to drive without discomfort in R knee.    Status  Achieved        PT Long Term Goals - 06/13/18 0903      PT LONG TERM GOAL #1   Title  Patient able to perform functional squats and lunges with full ROM and strength.    Status  Achieved      PT LONG TERM GOAL #2   Title  Patient able to transfer from floor to standing without difficulty.    Status  Achieved      PT LONG TERM GOAL #3   Title  Improved SLS time to 10 sec on the right to improve balance.    Status  Achieved      PT LONG TERM GOAL #4  Title  Patient to report no incidence of disclocation in R knee.    Status  Achieved      PT LONG TERM GOAL #5   Title  patient to demo 5/5 R knee and hip strength to improve function.    Status  Achieved            Plan - 06/13/18 0859    Clinical Impression Statement  Patient returned form vacation feeling more confident in her knee but with a lot of questions about exercises to do, what to avoid, how to proceed and advance.  She has a lot of good ideas but she also reports some fear and a lot of issues with being able to do exercises at a gym vs at home, I feel that we did a lot of good education today for her to be able to do the HEP at home and advance safely    PT Next Visit Plan  D/C with goals met.  Patient to try on her own    Consulted and Agree with Plan of Care  Patient       Patient will benefit from skilled therapeutic intervention in order to improve the following deficits and impairments:  Decreased strength, Decreased balance, Impaired flexibility, Pain  Visit Diagnosis: Muscle weakness (generalized)  PHYSICAL THERAPY DISCHARGE SUMMARY  Visits from Start of Care: 8   Plan: Patient agrees to discharge.   Patient goals were met. Patient is being discharged due to meeting the stated rehab goals.  ?????        Problem List There are no active problems to display for this patient.   Sumner Boast ., PT 06/13/2018, 9:04 AM  [place] [place], [place], [address] Phone: [phone]   Fax:  [phone]  Name: Denise Walsh MRN: [id number] Date of Birth: July 03, 1951

## 2018-06-14 ENCOUNTER — Ambulatory Visit: Payer: Medicare Other | Admitting: Physical Therapy

## 2018-06-16 ENCOUNTER — Ambulatory Visit: Payer: Medicare Other | Admitting: Physical Therapy

## 2018-06-20 ENCOUNTER — Ambulatory Visit: Payer: Medicare Other | Admitting: Physical Therapy

## 2018-06-21 ENCOUNTER — Ambulatory Visit: Payer: Medicare Other | Admitting: Physical Therapy

## 2018-06-23 ENCOUNTER — Ambulatory Visit: Payer: Medicare Other | Admitting: Physical Therapy

## 2018-06-28 DIAGNOSIS — J4530 Mild persistent asthma, uncomplicated: Secondary | ICD-10-CM | POA: Diagnosis not present

## 2018-06-28 DIAGNOSIS — Z131 Encounter for screening for diabetes mellitus: Secondary | ICD-10-CM | POA: Diagnosis not present

## 2018-06-28 DIAGNOSIS — E559 Vitamin D deficiency, unspecified: Secondary | ICD-10-CM | POA: Diagnosis not present

## 2018-07-30 DIAGNOSIS — Z23 Encounter for immunization: Secondary | ICD-10-CM | POA: Diagnosis not present

## 2018-08-31 DIAGNOSIS — M810 Age-related osteoporosis without current pathological fracture: Secondary | ICD-10-CM | POA: Diagnosis not present

## 2018-08-31 DIAGNOSIS — Z1159 Encounter for screening for other viral diseases: Secondary | ICD-10-CM | POA: Diagnosis not present

## 2018-08-31 DIAGNOSIS — Z0000 Encounter for general adult medical examination without abnormal findings: Secondary | ICD-10-CM | POA: Diagnosis not present

## 2018-08-31 DIAGNOSIS — Z23 Encounter for immunization: Secondary | ICD-10-CM | POA: Diagnosis not present

## 2018-08-31 DIAGNOSIS — E559 Vitamin D deficiency, unspecified: Secondary | ICD-10-CM | POA: Diagnosis not present

## 2018-08-31 DIAGNOSIS — Z131 Encounter for screening for diabetes mellitus: Secondary | ICD-10-CM | POA: Diagnosis not present

## 2018-08-31 DIAGNOSIS — Z124 Encounter for screening for malignant neoplasm of cervix: Secondary | ICD-10-CM | POA: Diagnosis not present

## 2018-08-31 DIAGNOSIS — J4530 Mild persistent asthma, uncomplicated: Secondary | ICD-10-CM | POA: Diagnosis not present

## 2018-08-31 DIAGNOSIS — Z1211 Encounter for screening for malignant neoplasm of colon: Secondary | ICD-10-CM | POA: Diagnosis not present

## 2018-08-31 DIAGNOSIS — J309 Allergic rhinitis, unspecified: Secondary | ICD-10-CM | POA: Diagnosis not present

## 2018-09-01 DIAGNOSIS — L680 Hirsutism: Secondary | ICD-10-CM | POA: Diagnosis not present

## 2018-09-01 DIAGNOSIS — L658 Other specified nonscarring hair loss: Secondary | ICD-10-CM | POA: Diagnosis not present

## 2018-09-01 DIAGNOSIS — Z87891 Personal history of nicotine dependence: Secondary | ICD-10-CM | POA: Diagnosis not present

## 2018-09-01 DIAGNOSIS — E65 Localized adiposity: Secondary | ICD-10-CM | POA: Diagnosis not present

## 2018-09-01 DIAGNOSIS — M810 Age-related osteoporosis without current pathological fracture: Secondary | ICD-10-CM | POA: Diagnosis not present

## 2018-09-01 DIAGNOSIS — E559 Vitamin D deficiency, unspecified: Secondary | ICD-10-CM | POA: Diagnosis not present

## 2018-09-01 DIAGNOSIS — Z87442 Personal history of urinary calculi: Secondary | ICD-10-CM | POA: Diagnosis not present

## 2018-09-05 DIAGNOSIS — E65 Localized adiposity: Secondary | ICD-10-CM | POA: Diagnosis not present

## 2018-09-05 DIAGNOSIS — E559 Vitamin D deficiency, unspecified: Secondary | ICD-10-CM | POA: Diagnosis not present

## 2018-09-05 DIAGNOSIS — L658 Other specified nonscarring hair loss: Secondary | ICD-10-CM | POA: Diagnosis not present

## 2018-09-05 DIAGNOSIS — Z87891 Personal history of nicotine dependence: Secondary | ICD-10-CM | POA: Diagnosis not present

## 2018-09-05 DIAGNOSIS — Z87442 Personal history of urinary calculi: Secondary | ICD-10-CM | POA: Diagnosis not present

## 2018-09-05 DIAGNOSIS — M810 Age-related osteoporosis without current pathological fracture: Secondary | ICD-10-CM | POA: Diagnosis not present

## 2018-09-05 DIAGNOSIS — L680 Hirsutism: Secondary | ICD-10-CM | POA: Diagnosis not present

## 2018-10-03 DIAGNOSIS — M810 Age-related osteoporosis without current pathological fracture: Secondary | ICD-10-CM | POA: Diagnosis not present

## 2018-10-03 DIAGNOSIS — Z87891 Personal history of nicotine dependence: Secondary | ICD-10-CM | POA: Diagnosis not present

## 2018-10-03 DIAGNOSIS — L659 Nonscarring hair loss, unspecified: Secondary | ICD-10-CM | POA: Diagnosis not present

## 2018-10-03 DIAGNOSIS — Z87442 Personal history of urinary calculi: Secondary | ICD-10-CM | POA: Diagnosis not present

## 2018-10-03 DIAGNOSIS — E559 Vitamin D deficiency, unspecified: Secondary | ICD-10-CM | POA: Diagnosis not present

## 2018-11-01 ENCOUNTER — Encounter: Payer: Self-pay | Admitting: Physical Therapy

## 2018-11-01 ENCOUNTER — Other Ambulatory Visit: Payer: Self-pay

## 2018-11-01 ENCOUNTER — Ambulatory Visit: Payer: Medicare Other | Attending: Internal Medicine | Admitting: Physical Therapy

## 2018-11-01 DIAGNOSIS — R2681 Unsteadiness on feet: Secondary | ICD-10-CM

## 2018-11-01 DIAGNOSIS — M6281 Muscle weakness (generalized): Secondary | ICD-10-CM | POA: Diagnosis not present

## 2018-11-01 NOTE — Therapy (Signed)
[address] [place], [place], [address] Phone: [phone]   Fax:  [phone]  Physical Therapy Evaluation  Patient Details  Name: Denise Walsh MRN: [id number] Date of Birth: 01/11/1951 Referring Provider (PT): Dagmar Hait   Encounter Date: 11/01/2018  PT End of Session - 11/01/18 0842    Visit Number  1    Date for PT Re-Evaluation  01/01/19    PT Start Time  0756    PT Stop Time  0842    PT Time Calculation (min)  46 min    Activity Tolerance  Patient tolerated treatment well    Behavior During Therapy  [facility] for tasks assessed/performed       Past Medical History:  Diagnosis Date  . Asthma   . Osteoporosis     History reviewed. No pertinent surgical history.  There were no vitals filed for this visit.   Subjective Assessment - 11/01/18 0801    Subjective  Patient was seen here earlier in the year for ankle and knee issue.  She did well, she reports some recent close falls and stumbles, she was seen recently for bone density test and she has osteoporosis.  She is worried about this and her stumbles.    Patient Stated Goals  be stronger, have less stumbles    Currently in Pain?  No/denies         [facility] PT Assessment - 11/01/18 0001      Assessment   Medical Diagnosis  weakness, unsteady gait    Referring Provider (PT)  Dagmar Hait    Onset Date/Surgical Date  10/01/18    Prior Therapy  for ankle and knee earlier this year      Precautions   Precautions  None      Balance Screen   Has the patient fallen in the past 6 months  No    Has the patient had a decrease in activity level because of a fear of falling?   No    Is the patient reluctant to leave their home because of a fear of falling?   No      Home Environment   Additional Comments  2 story home no problem with stairs, does housework      Prior Function   Level of Independence  Independent    Vocation  Full time employment     Vocation Requirements  mostly sitting    Leisure  no exercise      Posture/Postural Control   Posture Comments  slight fwd head, rounded shoulders      ROM / Strength   AROM / PROM / Strength  AROM;Strength      AROM   Overall AROM Comments  Lumbar ROM decreased 25%      Strength   Overall Strength Comments  Shoulders 3+/5, LE's 4-/5      Flexibility   Soft Tissue Assessment /Muscle Length  yes    Hamstrings  Bil HS tightness marked    Quadriceps  bil moderate    Piriformis  mild bilateral      Special Tests   Other special tests  functional squat she could not go below 60 degrees      Standardized Balance Assessment   Standardized Balance Assessment  Berg Balance Test      Berg Balance Test   Sit to Stand  Able to stand without using hands and stabilize independently    Standing Unsupported  Able to stand safely 2 minutes    Sitting with Back Unsupported but Feet Supported on Floor or Stool  Able to sit safely and securely 2 minutes    Stand to Sit  Sits safely with minimal use of hands    Transfers  Able to transfer safely, minor use of hands    Standing Unsupported with Eyes Closed  Able to stand 10 seconds with supervision    Standing Ubsupported with Feet Together  Able to place feet together independently and stand for 1 minute with supervision    From Standing, Reach Forward with Outstretched Arm  Can reach confidently >25 cm (10")    From Standing Position, Pick up Object from [place] to pick up shoe safely and easily    From Standing Position, Turn to Look Behind Over each Shoulder  Looks behind from both sides and weight shifts well    Turn 360 Degrees  Able to turn 360 degrees safely in 4 seconds or less    Standing Unsupported, Alternately Place Feet on Step/Stool  Able to stand independently and safely and complete 8 steps in 20 seconds    Standing Unsupported, One Foot in Front  Able to plae foot ahead of the other independently and hold 30 seconds     Standing on One Leg  Able to lift leg independently and hold 5-10 seconds    Total Score  52                Objective measurements completed on examination: See above findings.              PT Education - 11/01/18 0841    Education Details  HEP with tband for general overall resistance training    Person(s) Educated  Patient    Methods  Explanation;Demonstration;Handout;Verbal cues;Tactile cues    Comprehension  Verbalized understanding;Returned demonstration;Verbal cues required       PT Short Term Goals - 11/01/18 0921      PT SHORT TERM GOAL #1   Title  I with inial HEP     Time  2    Period  Weeks    Status  New        PT Long Term Goals - 11/01/18 2505      PT LONG TERM GOAL #1   Title  Patient able to perform functional squats and lunges with full ROM and strength.    Time  8    Period  Weeks    Status  New      PT LONG TERM GOAL #2   Title  Patient able to transfer from floor to standing without difficulty.    Time  8    Period  Weeks    Status  New      PT LONG TERM GOAL #3   Title  Improved SLS time to 10 sec bilaterally to improve balance.    Time  8    Period  Weeks    Status  New      PT LONG TERM GOAL #4   Title  independent and safe with gym or advanced HEP    Time  8    Period  Weeks    Status  New             Plan - 11/01/18 3976    Clinical Impression Statement  Patient with recent diagnosis of osteoporosis, she reports that she is fearful due to her not feeling steady on her  feet at times and that she has had some stumbles.  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  Her shoulder strength was 3+/5, legs 4-/5.  Her Berg balance test was 52/56, I did try higher level balance activities with a dynamic surface and this caused her much more instability.    Clinical Presentation  Stable    Clinical Decision Making  Low    Rehab Potential  Good    PT Frequency  2x  / week    PT Duration  8 weeks    PT Treatment/Interventions  ADLs/Self Care Home Management;Functional mobility training;Therapeutic activities;Therapeutic exercise;Patient/family education;Balance training;Neuromuscular re-education    PT Next Visit Plan  see how her exercises went, add balance to HEP and ergonomics, start gym    Consulted and Agree with Plan of Care  Patient       Patient will benefit from skilled therapeutic intervention in order to improve the following deficits and impairments:  Postural dysfunction, Decreased strength, Decreased balance, Impaired flexibility  Visit Diagnosis: Muscle weakness (generalized) - Plan: PT plan of care cert/re-cert  Unsteadiness on feet - Plan: PT plan of care cert/re-cert     Problem List There are no active problems to display for this patient.   Sumner Boast., PT 11/01/2018, 9:23 AM  [address] [place], [place], [address] Phone: [phone]   Fax:  [phone]  Name: Denise Walsh MRN: [id number] Date of Birth: June 04, 1951

## 2018-11-04 ENCOUNTER — Encounter: Payer: Self-pay | Admitting: Physical Therapy

## 2018-11-04 ENCOUNTER — Ambulatory Visit: Payer: Medicare Other | Attending: Internal Medicine | Admitting: Physical Therapy

## 2018-11-04 DIAGNOSIS — R600 Localized edema: Secondary | ICD-10-CM | POA: Insufficient documentation

## 2018-11-04 DIAGNOSIS — M6281 Muscle weakness (generalized): Secondary | ICD-10-CM

## 2018-11-04 DIAGNOSIS — R2681 Unsteadiness on feet: Secondary | ICD-10-CM | POA: Diagnosis not present

## 2018-11-04 NOTE — Therapy (Signed)
[address] [place], [place], [address] Phone: [phone]   Fax:  [phone]  Physical Therapy Treatment  Patient Details  Name: Denise Walsh MRN: [id number] Date of Birth: [birth year] Referring Provider (PT): Dagmar Hait   Encounter Date: 11/04/2018  PT End of Session - 11/04/18 0844    Visit Number  2    Date for PT Re-Evaluation  01/01/19    PT Start Time  0755    PT Stop Time  0840    PT Time Calculation (min)  45 min    Activity Tolerance  Patient tolerated treatment well    Behavior During Therapy  [facility] for tasks assessed/performed       Past Medical History:  Diagnosis Date  . Asthma   . Osteoporosis     History reviewed. No pertinent surgical history.  There were no vitals filed for this visit.  Subjective Assessment - 11/04/18 0759    Subjective  Patient reports that she tried the exercises, reports no issues    Currently in Pain?  No/denies                       [facility] Adult PT Treatment/Exercise - 11/04/18 0001      Exercises   Exercises  Knee/Hip      Knee/Hip Exercises: Aerobic   Tread Mill  2.6 MPH x 5 minutes      Knee/Hip Exercises: Machines for Strengthening   Cybex Knee Extension  5# 3x10    Cybex Knee Flexion  20# 3x10    Cybex Leg Press  20# 3x10    Other Machine  seated row 20#, lats 20# chest press 5-10# all 3x210      Knee/Hip Exercises: Standing   Walking with Sports Cord  resisted gait all directions x 80 feet               PT Short Term Goals - 11/01/18 0962      PT SHORT TERM GOAL #1   Title  I with inial HEP     Time  2    Period  Weeks    Status  New        PT Long Term Goals - 11/01/18 8366      PT LONG TERM GOAL #1   Title  Patient able to perform functional squats and lunges with full ROM and strength.    Time  8    Period  Weeks    Status  New      PT LONG TERM GOAL #2   Title  Patient able to transfer from  floor to standing without difficulty.    Time  8    Period  Weeks    Status  New      PT LONG TERM GOAL #3   Title  Improved SLS time to 10 sec bilaterally to improve balance.    Time  8    Period  Weeks    Status  New      PT LONG TERM GOAL #4   Title  independent and safe with gym or advanced HEP    Time  8    Period  Weeks    Status  New            Plan - 11/04/18 0844    Clinical Impression Statement  Patient had mm fatigue and soreness with the activities added today, some short of  breath with the resisted gait.  We started talking about the progression to home or a gym.    PT Next Visit Plan  see if she was sore and progress accordingly, could add balance activities as she reports unsteady with any squats    Consulted and Agree with Plan of Care  Patient       Patient will benefit from skilled therapeutic intervention in order to improve the following deficits and impairments:  Postural dysfunction, Decreased strength, Decreased balance, Impaired flexibility  Visit Diagnosis: Muscle weakness (generalized)  Unsteadiness on feet     Problem List There are no active problems to display for this patient.   Sumner Boast., PT 11/04/2018, 8:47 AM  [address] [place], [place], [address] Phone: [phone]   Fax:  [phone]  Name: Carol Theys MRN: [id number] Date of Birth: 01-Mar-1951

## 2018-11-08 ENCOUNTER — Ambulatory Visit: Payer: Medicare Other | Admitting: Physical Therapy

## 2018-11-08 DIAGNOSIS — R600 Localized edema: Secondary | ICD-10-CM | POA: Diagnosis not present

## 2018-11-08 DIAGNOSIS — R2681 Unsteadiness on feet: Secondary | ICD-10-CM

## 2018-11-08 DIAGNOSIS — M6281 Muscle weakness (generalized): Secondary | ICD-10-CM

## 2018-11-08 NOTE — Therapy (Signed)
[address] [place], [place], [address] Phone: [phone]   Fax:  [phone]  Physical Therapy Treatment  Patient Details  Name: Denise Walsh MRN: [id number] Date of Birth: 08-30-1951 Referring Provider (PT): Dagmar Hait   Encounter Date: 11/08/2018  PT End of Session - 11/08/18 0815    Visit Number  3    Date for PT Re-Evaluation  01/01/19    PT Start Time  0804    PT Stop Time  3545    PT Time Calculation (min)  39 min    Activity Tolerance  Patient tolerated treatment well;No increased pain    Behavior During Therapy  [facility] for tasks assessed/performed       Past Medical History:  Diagnosis Date  . Asthma   . Osteoporosis     No past surgical history on file.  There were no vitals filed for this visit.  Subjective Assessment - 11/08/18 0859    Subjective  "I am very committed".  Pt reports she has done her exercises everyday, 10 reps 1 set of each.  This has become easy.     Patient Stated Goals  be stronger, have less stumbles    Currently in Pain?  No/denies         [facility] PT Assessment - 11/08/18 0001      Assessment   Medical Diagnosis  weakness, unsteady gait    Referring Provider (PT)  Dagmar Hait    Onset Date/Surgical Date  10/01/18    Prior Therapy  for ankle and knee earlier this year       [facility] Adult PT Treatment/Exercise - 11/08/18 0001      Exercises   Exercises  Hand      Lumbar Exercises: Stretches   Other Lumbar Stretch Exercise  Doorway stretch - 3 positions, 15 sec each position (unilateral with high position);  seated thoracic ext stretch over back of chair x 5 sec x 3 reps;  seated side stretch with arm over head x 2 reps each side.     Other Lumbar Stretch Exercise  seated table slide (bilat shoulder flexion) x 15 sec x 2 reps.     Passive Hamstring Stretch  Right;Left;2 reps;20 seconds   seated   Quad Stretch  Right;Left;2 reps;20 seconds   seated, foot  under chair   Piriformis Stretch  Right;Left;2 reps;20 seconds   seated     Lumbar Exercises: Machines for Strengthening   Tread Mill  2.6 MPH x 5 minutes   PTA present to monitor and discuss progress     Knee/Hip Exercises: Stretches   Press photographer  Right;Left;2 reps;20 seconds      Knee/Hip Exercises: Standing   Functional Squat  10 reps   sit to stand, to chair, no hands   SLS  15 sec x 2 reps each side; repeated with horiz head turns x 15 seconds     Other Standing Knee Exercises  tandem stance x 15 sec each side with horiz head turns.       Hand Exercises   Other Hand Exercises  Rt thumb stretches in all directions including thumb tucked with ulnar deviation              PT Education - 11/08/18 0845    Education Details  HEP -     Person(s) Educated  Patient    Methods  Explanation;Demonstration;Verbal cues;Handout    Comprehension  Verbalized understanding;Returned demonstration  PT Short Term Goals - 11/01/18 9024      PT SHORT TERM GOAL #1   Title  I with inial HEP     Time  2    Period  Weeks    Status  New        PT Long Term Goals - 11/01/18 0973      PT LONG TERM GOAL #1   Title  Patient able to perform functional squats and lunges with full ROM and strength.    Time  8    Period  Weeks    Status  New      PT LONG TERM GOAL #2   Title  Patient able to transfer from floor to standing without difficulty.    Time  8    Period  Weeks    Status  New      PT LONG TERM GOAL #3   Title  Improved SLS time to 10 sec bilaterally to improve balance.    Time  8    Period  Weeks    Status  New      PT LONG TERM GOAL #4   Title  independent and safe with gym or advanced HEP    Time  8    Period  Weeks    Status  New            Plan - 11/08/18 0847    Clinical Impression Statement  Pt issued exercises she can do at her desk/work, including stretches, since her work load will be increasing.  She reported fatigue in her LE with 10  reps of sit to stand (eccentric lowering) and with SLS of 15 seconds.   Pt making progress towards goals. Pt will benefit from continued PT intervention to maximize functional mobility and safety.     Rehab Potential  Good    PT Frequency  2x / week    PT Duration  8 weeks    PT Treatment/Interventions  ADLs/Self Care Home Management;Functional mobility training;Therapeutic activities;Therapeutic exercise;Patient/family education;Balance training;Neuromuscular re-education    PT Next Visit Plan  assess response to new HEP exercises; add resistance exercises for LE as tolerated. assess shoulder strength.     PT Home Exercise Plan  [facility] Access Code: [facility]     Consulted and Agree with Plan of Care  Patient       Patient will benefit from skilled therapeutic intervention in order to improve the following deficits and impairments:  Postural dysfunction, Decreased strength, Decreased balance, Impaired flexibility  Visit Diagnosis: Muscle weakness (generalized)  Unsteadiness on feet     Problem List There are no active problems to display for this patient.  Kerin Perna, PTA 11/08/18 9:02 AM  [address] [place], [place], [address] Phone: [phone]   Fax:  [phone]  Name: Denise Walsh MRN: [id number] Date of Birth: Dec 19, 1950

## 2018-11-08 NOTE — Patient Instructions (Signed)
Access Code: [facility]  URL: [email or web address]  Date: 11/08/2018  Prepared by: Kerin Perna   Exercises  Single Arm Doorway Pec Stretch at 120 Degrees Abduction - 2 reps - 1 sets - 15-20 seconds hold - 1-2x daily - 7x weekly  Doorway Pec Stretch at 90 Degrees Abduction - 2 reps - 1 sets - 15-20 seconds hold - 1-2x daily - 7x weekly  Seated Thoracic Lumbar Extension with Pectoralis Stretch - 2 reps - 1 sets - 3-5 hold - 1-2x daily - 7x weekly  Seated Hamstring Stretch - 2 reps - 1 sets - 20 seconds hold - 1-2x daily - 7x weekly  Sit to Stand without Arm Support - 10 reps - 1 sets - 1x daily - 7x weekly  Single Leg Stance - 2-3 reps - 1 sets - 15-30 seconds hold - 1x daily - 7x weekly  Gastroc Stretch on Wall - 2 reps - 1 sets - 20 seconds hold - 1x daily - 7x weekly  Tandem Stance - 2 reps - 1 sets - 15-30 seconds hold - 1x daily - 7x weekly  Thumb PROM Composite Extension - 2 reps - 1 sets - 15-20 hold - 2x daily - 7x weekly

## 2018-11-11 ENCOUNTER — Ambulatory Visit: Payer: Medicare Other | Admitting: Physical Therapy

## 2018-11-11 ENCOUNTER — Encounter: Payer: Self-pay | Admitting: Physical Therapy

## 2018-11-11 DIAGNOSIS — R2681 Unsteadiness on feet: Secondary | ICD-10-CM | POA: Diagnosis not present

## 2018-11-11 DIAGNOSIS — R600 Localized edema: Secondary | ICD-10-CM | POA: Diagnosis not present

## 2018-11-11 DIAGNOSIS — M6281 Muscle weakness (generalized): Secondary | ICD-10-CM

## 2018-11-11 NOTE — Therapy (Signed)
[address] [place], [place], [address] Phone: [phone]   Fax:  [phone]  Physical Therapy Treatment  Patient Details  Name: Denise Walsh MRN: [id number] Date of Birth: June 15, 1951 Referring Provider (PT): Dagmar Hait   Encounter Date: 11/11/2018  PT End of Session - 11/11/18 0842    Visit Number  4    Date for PT Re-Evaluation  01/01/19    PT Start Time  0801    PT Stop Time  6256    PT Time Calculation (min)  41 min    Activity Tolerance  Patient tolerated treatment well;No increased pain    Behavior During Therapy  [facility] for tasks assessed/performed       Past Medical History:  Diagnosis Date  . Asthma   . Osteoporosis     History reviewed. No pertinent surgical history.  There were no vitals filed for this visit.  Subjective Assessment - 11/11/18 0802    Subjective  "I feel fine"    Currently in Pain?  No/denies                       [facility] Adult PT Treatment/Exercise - 11/11/18 0001      Knee/Hip Exercises: Aerobic   Elliptical  I10 R5 2 min each way    Tread Mill  3 MPH x 4 minutes    Nustep  L5 x 6 min       Knee/Hip Exercises: Machines for Strengthening   Cybex Knee Extension  5# 3x10    Cybex Knee Flexion  20# 3x10    Cybex Leg Press  20# 3x10    Other Machine  seated row 20#, lats 20# chest press 5-10# all 3x10      Knee/Hip Exercises: Standing   Walking with Sports Cord  40lb 3 way x3       Knee/Hip Exercises: Seated   Sit to Sand  2 sets;10 reps;without UE support   OHP yellow ball               PT Short Term Goals - 11/11/18 3893      PT SHORT TERM GOAL #1   Title  I with inial HEP     Status  Achieved        PT Long Term Goals - 11/11/18 0843      PT LONG TERM GOAL #1   Title  Patient able to perform functional squats and lunges with full ROM and strength.    Status  Partially Met            Plan - 11/11/18 0843    Clinical Impression Statement  Pt did well and is progressing towards goals. Good strength and ROM with all exercises interventions. No reports increase pain only muscle fatigue with exercises. Knees dies slightly adduct with sit to stands, pt given cues to correct.     Rehab Potential  Good    PT Frequency  2x / week    PT Duration  8 weeks    PT Treatment/Interventions  ADLs/Self Care Home Management;Functional mobility training;Therapeutic activities;Therapeutic exercise;Patient/family education;Balance training;Neuromuscular re-education    PT Next Visit Plan  Full body functional strength training    PT Home Exercise Plan  Sit to stands        Patient will benefit from skilled therapeutic intervention in order to improve the following deficits and impairments:  Postural dysfunction, Decreased strength, Decreased balance, Impaired  flexibility  Visit Diagnosis: Muscle weakness (generalized)  Unsteadiness on feet  Localized edema     Problem List There are no active problems to display for this patient.   Scot Jun, PTA 11/11/2018, 8:47 AM  [address] [place], [place], [address] Phone: [phone]   Fax:  [phone]  Name: Shamarra Warda MRN: [id number] Date of Birth: 06-15-1951

## 2018-11-15 ENCOUNTER — Encounter: Payer: Self-pay | Admitting: Physical Therapy

## 2018-11-15 ENCOUNTER — Ambulatory Visit: Payer: Medicare Other | Admitting: Physical Therapy

## 2018-11-15 DIAGNOSIS — R2681 Unsteadiness on feet: Secondary | ICD-10-CM

## 2018-11-15 DIAGNOSIS — R600 Localized edema: Secondary | ICD-10-CM | POA: Diagnosis not present

## 2018-11-15 DIAGNOSIS — M6281 Muscle weakness (generalized): Secondary | ICD-10-CM | POA: Diagnosis not present

## 2018-11-15 NOTE — Therapy (Signed)
[address] [place], [place], [address] Phone: [phone]   Fax:  [phone]  Physical Therapy Treatment  Patient Details  Name: Denise Walsh MRN: [id number] Date of Birth: [birth year] Referring Provider (PT): Dagmar Hait   Encounter Date: 11/15/2018  PT End of Session - 11/15/18 0838    Visit Number  5    Date for PT Re-Evaluation  01/01/19    PT Start Time  0800    PT Stop Time  5300    PT Time Calculation (min)  44 min    Activity Tolerance  Patient tolerated treatment well;No increased pain    Behavior During Therapy  W[facility] for tasks assessed/performed       Past Medical History:  Diagnosis Date  . Asthma   . Osteoporosis     History reviewed. No pertinent surgical history.  There were no vitals filed for this visit.  Subjective Assessment - 11/15/18 0802    Subjective  Overall pt reports she is doing fine. She stated that she has some pain behind her R knee Sunday when she was walking.     Currently in Pain?  No/denies                       [facility] Adult PT Treatment/Exercise - 11/15/18 0001      Ambulation/Gait   Gait Comments  3 flights alt pattern no rail holdoing 3lb dumbbells       Lumbar Exercises: Standing   Shoulder Extension  20 reps;Power Tower;Strengthening    Shoulder Extension Limitations  5lb      Knee/Hip Exercises: Aerobic   Elliptical  I10 R5 2 min each way    Tread Mill  3 MPH x 5 minutes      Knee/Hip Exercises: Machines for Strengthening   Cybex Knee Extension  10# 3x10    Cybex Knee Flexion  25# 3x10    Cybex Leg Press  30# 2x15    Other Machine  seated row 25#, lats 25# chest press 5-10# all 3x10      Knee/Hip Exercises: Standing   Walking with Sports Cord  40lb 3 way x3       Knee/Hip Exercises: Seated   Sit to Sand  without UE support;1 set;15 reps               PT Short Term Goals - 11/11/18 5110      PT SHORT TERM GOAL #1    Title  I with inial HEP     Status  Achieved        PT Long Term Goals - 11/11/18 0843      PT LONG TERM GOAL #1   Title  Patient able to perform functional squats and lunges with full ROM and strength.    Status  Partially Met            Plan - 11/15/18 2111    Clinical Impression Statement  No pain reported entering clinic. She did state some posterior R knee pain on her walk Sunday. The symptoms were not created during today's session. Pt overall did well with stair negotiation but displayed some fatigue. Tolerated increase weight on all machine interventions. Increase weight cause some muscle fatigue.     PT Frequency  2x / week    PT Duration  8 weeks    PT Treatment/Interventions  ADLs/Self Care Home Management;Functional mobility training;Therapeutic activities;Therapeutic exercise;Patient/family education;Balance training;Neuromuscular  re-education    PT Next Visit Plan  Full body functional strenght training       Patient will benefit from skilled therapeutic intervention in order to improve the following deficits and impairments:  Postural dysfunction, Decreased strength, Decreased balance, Impaired flexibility  Visit Diagnosis: Muscle weakness (generalized)  Unsteadiness on feet     Problem List There are no active problems to display for this patient.   Scot Jun, PTA  11/15/2018, 8:41 AM  [address] [place], [place], [address] Phone: [phone]   Fax:  [phone]  Name: Denise Walsh MRN: [id number] Date of Birth: 06/21/1951

## 2018-11-18 ENCOUNTER — Ambulatory Visit: Payer: Medicare Other | Admitting: Physical Therapy

## 2018-11-18 ENCOUNTER — Encounter: Payer: Self-pay | Admitting: Physical Therapy

## 2018-11-18 DIAGNOSIS — R2681 Unsteadiness on feet: Secondary | ICD-10-CM

## 2018-11-18 DIAGNOSIS — M6281 Muscle weakness (generalized): Secondary | ICD-10-CM | POA: Diagnosis not present

## 2018-11-18 DIAGNOSIS — R600 Localized edema: Secondary | ICD-10-CM | POA: Diagnosis not present

## 2018-11-18 NOTE — Therapy (Signed)
[address] [place], [place], [address] Phone: [phone]   Fax:  [phone]  Physical Therapy Treatment  Patient Details  Name: Denise Walsh MRN: [id number] Date of Birth: Sep 17, 1951 Referring Provider (PT): Dagmar Hait   Encounter Date: 11/18/2018  PT End of Session - 11/18/18 0841    Visit Number  6    Date for PT Re-Evaluation  01/01/19    PT Start Time  0756    PT Stop Time  0838    PT Time Calculation (min)  42 min    Activity Tolerance  Patient tolerated treatment well;No increased pain    Behavior During Therapy  WFL for tasks assessed/performed       Past Medical History:  Diagnosis Date  . Asthma   . Osteoporosis     History reviewed. No pertinent surgical history.  There were no vitals filed for this visit.  Subjective Assessment - 11/18/18 0759    Subjective  Pt reports that she feels fine    Currently in Pain?  No/denies                       [facility] Adult PT Treatment/Exercise - 11/18/18 0001      Ambulation/Gait   Gait Comments  2 flights alt pattern no rail holdoing 3lb dumbbells, SS with OHP x10        Lumbar Exercises: Standing   Other Standing Lumbar Exercises  Bicep curls 10lb, tricep ext 25lb 2x10     Other Standing Lumbar Exercises  RDL to 6in boc 3lb dumbbells 2x10       Lumbar Exercises: Seated   Sit to Stand  10 reps   OHP blue ball at top 8lb      Knee/Hip Exercises: Aerobic   Elliptical  I13 R7 38fd/ 2 bak      Knee/Hip Exercises: Machines for Strengthening   Cybex Knee Extension  10# 3x15    Cybex Knee Flexion  25# 3x15    Cybex Leg Press  40lb 2x10     Other Machine  seated row 25# 2x15, lats 25# chest press 10# all 3x10      Knee/Hip Exercises: Standing   Heel Raises  Both;2 sets;15 reps;2 seconds               PT Short Term Goals - 11/11/18 08453     PT SHORT TERM GOAL #1   Title  I with inial HEP     Status  Achieved         PT Long Term Goals - 11/18/18 06468     PT LONG TERM GOAL #1   Title  Patient able to perform functional squats and lunges with full ROM and strength.    Status  Partially Met      PT LONG TERM GOAL #2   Title  Patient able to transfer from floor to standing without difficulty.    Status  Achieved      PT LONG TERM GOAL #3   Title  Improved SLS time to 10 sec bilaterally to improve balance.    Status  On-going      PT LONG TERM GOAL #4   Title  independent and safe with gym or advanced HEP    Status  On-going            Plan - 11/18/18 0842    Clinical Impression Statement  Pt  is doing well advancing her functional tolerance. Super set OHP with her stair negotiation. She was able to perform RDL with good form, she did require postural cues to keep scapulas retracted at bottom of DL. Increase reps tolerated on leg press. Increase repetitions on seated curls and extensions.     Rehab Potential  Good    PT Frequency  2x / week    PT Duration  8 weeks    PT Treatment/Interventions  ADLs/Self Care Home Management;Functional mobility training;Therapeutic activities;Therapeutic exercise;Patient/family education;Balance training;Neuromuscular re-education    PT Next Visit Plan  Full body functional strength training       Patient will benefit from skilled therapeutic intervention in order to improve the following deficits and impairments:  Postural dysfunction, Decreased strength, Decreased balance, Impaired flexibility  Visit Diagnosis: Muscle weakness (generalized)  Unsteadiness on feet  Localized edema     Problem List There are no active problems to display for this patient.   Scot Jun, PTA 11/18/2018, 8:45 AM  [address] [place], [place], [address] Phone: [phone]   Fax:  [phone]  Name: Hanni Milford MRN: [id number] Date of Birth: 05-28-1951

## 2018-11-21 ENCOUNTER — Ambulatory Visit: Payer: Medicare Other | Admitting: Physical Therapy

## 2018-11-21 ENCOUNTER — Encounter: Payer: Self-pay | Admitting: Physical Therapy

## 2018-11-21 DIAGNOSIS — R2681 Unsteadiness on feet: Secondary | ICD-10-CM

## 2018-11-21 DIAGNOSIS — M6281 Muscle weakness (generalized): Secondary | ICD-10-CM

## 2018-11-21 DIAGNOSIS — R600 Localized edema: Secondary | ICD-10-CM | POA: Diagnosis not present

## 2018-11-21 NOTE — Therapy (Signed)
[address] [place], [place], [address] Phone: [phone]   Fax:  [phone]  Physical Therapy Treatment  Patient Details  Name: Denise Walsh MRN: [id number] Date of Birth: 1951-04-29 Referring Provider (PT): Dagmar Hait   Encounter Date: 11/21/2018  PT End of Session - 11/21/18 0844    Visit Number  7    Date for PT Re-Evaluation  01/01/19    PT Start Time  0800    PT Stop Time  0845    PT Time Calculation (min)  45 min    Activity Tolerance  Patient tolerated treatment well;No increased pain    Behavior During Therapy  WFL for tasks assessed/performed       Past Medical History:  Diagnosis Date  . Asthma   . Osteoporosis     History reviewed. No pertinent surgical history.  There were no vitals filed for this visit.  Subjective Assessment - 11/21/18 0804    Subjective  "I was sore after last time"    Currently in Pain?  No/denies                       [facility] Adult PT Treatment/Exercise - 11/21/18 0001      Lumbar Exercises: Aerobic   UBE (Upper Arm Bike)  L4 x2 min each way      Lumbar Exercises: Standing   Other Standing Lumbar Exercises  Bicep curls 7lb dumbbells , tricep ext 20lb pulley 2x10     Other Standing Lumbar Exercises  RDL to 6in boc 4lb dumbbells 2x10       Lumbar Exercises: Seated   Sit to Stand  10 reps   x2, OHP wiht 3lb dumbbells      Knee/Hip Exercises: Aerobic   Elliptical  I10 R5 33fd/ 3 bak      Knee/Hip Exercises: Machines for Strengthening   Cybex Knee Extension  10# 2x15    Cybex Knee Flexion  25# 2x15    Cybex Leg Press  40lb 2x15    Other Machine  seated row 25#, 2x15, lats 25#, 20# chest press 10# all 3x10      Knee/Hip Exercises: Standing   Heel Raises  Both;2 sets;15 reps;2 seconds    Forward Step Up  1 set;Both;10 reps;Hand Hold: 0;Step Height: 6"   3lb dumbbells   Walking with Sports Cord  50lb 3 way x4                 PT Short Term Goals - 11/11/18 05462     PT SHORT TERM GOAL #1   Title  I with inial HEP     Status  Achieved        PT Long Term Goals - 11/21/18 0845      PT LONG TERM GOAL #1   Title  Patient able to perform functional squats and lunges with full ROM and strength.    Status  Partially Met            Plan - 11/21/18 0845    Clinical Impression Statement  Muscle fatigue after last session. Had to decrease weight with lat pull downs due to the weight being too heavy. Pt was able to perform the weight the previous treatment. Instability with resisted dais requiring min assist to correct.     Rehab Potential  Good    PT Frequency  2x / week    PT Duration  8  weeks    PT Treatment/Interventions  ADLs/Self Care Home Management;Functional mobility training;Therapeutic activities;Therapeutic exercise;Patient/family education;Balance training;Neuromuscular re-education    PT Next Visit Plan  Full body functional strenght training       Patient will benefit from skilled therapeutic intervention in order to improve the following deficits and impairments:  Postural dysfunction, Decreased strength, Decreased balance, Impaired flexibility  Visit Diagnosis: Muscle weakness (generalized)  Unsteadiness on feet  Localized edema     Problem List There are no active problems to display for this patient.   Scot Jun, PTA 11/21/2018, 8:48 AM  [address] [place], [place], [address] Phone: [phone]   Fax:  [phone]  Name: Denise Walsh MRN: [id number] Date of Birth: 1951-03-07

## 2018-11-25 ENCOUNTER — Ambulatory Visit: Payer: Medicare Other | Admitting: Physical Therapy

## 2018-11-25 ENCOUNTER — Encounter: Payer: Self-pay | Admitting: Physical Therapy

## 2018-11-25 DIAGNOSIS — M6281 Muscle weakness (generalized): Secondary | ICD-10-CM | POA: Diagnosis not present

## 2018-11-25 DIAGNOSIS — R600 Localized edema: Secondary | ICD-10-CM

## 2018-11-25 DIAGNOSIS — R2681 Unsteadiness on feet: Secondary | ICD-10-CM

## 2018-11-25 NOTE — Therapy (Signed)
[address] [place], [place], [address] Phone: [phone]   Fax:  [phone]  Physical Therapy Treatment  Patient Details  Name: Denise Walsh MRN: [id number] Date of Birth: [birth year] Referring Provider (PT): Dagmar Hait   Encounter Date: 11/25/2018  PT End of Session - 11/25/18 0844    Visit Number  8    Date for PT Re-Evaluation  01/01/19    PT Start Time  0804    PT Stop Time  0017    PT Time Calculation (min)  40 min    Activity Tolerance  Patient tolerated treatment well;No increased pain    Behavior During Therapy  WFL for tasks assessed/performed       Past Medical History:  Diagnosis Date  . Asthma   . Osteoporosis     History reviewed. No pertinent surgical history.  There were no vitals filed for this visit.  Subjective Assessment - 11/25/18 0806    Subjective  "I am all right"    Currently in Pain?  No/denies         [facility] PT Assessment - 11/25/18 0001      Strength   Overall Strength Comments  Shoulders 4/5, LE's 4/5                   [facility] Adult PT Treatment/Exercise - 11/25/18 0001      Lumbar Exercises: Seated   Other Seated Lumbar Exercises  Bottom loaded squats 10lb 2x10       Lumbar Exercises: Supine   Bridge  Compliant;20 reps;2 seconds    Other Supine Lumbar Exercises  Bilat slr 2x10    taxing   Other Supine Lumbar Exercises  Supine isometric bridge with chest ptress 6lb 2x10       Knee/Hip Exercises: Aerobic   Elliptical  I11 R7 2 fwd/ 2 bak    Nustep  L6 x 4 min       Knee/Hip Exercises: Machines for Strengthening   Cybex Knee Extension  5# SL 2x10    Cybex Knee Flexion  15#  SL 2x10    Cybex Leg Press  40lb x15, SL 20lb x10 each     Other Machine  seated row 25#, 2x15, lats 25#, 20# chest press 10# all 3x10      Knee/Hip Exercises: Standing   Heel Raises  Both;2 sets;15 reps;2 seconds    Other Standing Knee Exercises  Split squats x5  each               PT Short Term Goals - 11/11/18 4944      PT SHORT TERM GOAL #1   Title  I with inial HEP     Status  Achieved        PT Long Term Goals - 11/25/18 0807      PT LONG TERM GOAL #4   Title  independent and safe with gym or advanced HEP            Plan - 11/25/18 0847    Clinical Impression Statement  Pt is doing well and is progressing towards all goals. Weakness noted with SL on leg press. Good ROM with bottom loaded squats. Progressed to split squats, pt with some difficulty with balance and strength requiring some UE assist.     PT Frequency  2x / week    PT Treatment/Interventions  ADLs/Self Care Home Management;Functional mobility training;Therapeutic activities;Therapeutic exercise;Patient/family education;Balance training;Neuromuscular re-education  PT Next Visit Plan  Full body functional strenght training       Patient will benefit from skilled therapeutic intervention in order to improve the following deficits and impairments:  Postural dysfunction, Decreased strength, Decreased balance, Impaired flexibility  Visit Diagnosis: Muscle weakness (generalized)  Unsteadiness on feet  Localized edema     Problem List There are no active problems to display for this patient.   Scot Jun 11/25/2018, 8:49 AM  [place] [address] [place], [place], [address] Phone: [phone]   Fax:  [phone]  Name: Minta Fair MRN: [id number] Date of Birth: [birth year]

## 2018-11-28 ENCOUNTER — Ambulatory Visit: Payer: Medicare Other | Admitting: Physical Therapy

## 2018-11-28 DIAGNOSIS — R2681 Unsteadiness on feet: Secondary | ICD-10-CM

## 2018-11-28 DIAGNOSIS — R600 Localized edema: Secondary | ICD-10-CM | POA: Diagnosis not present

## 2018-11-28 DIAGNOSIS — M6281 Muscle weakness (generalized): Secondary | ICD-10-CM

## 2018-11-28 NOTE — Therapy (Signed)
[address] [place], [place], [address] Phone: [phone]   Fax:  [phone]  Physical Therapy Treatment  Patient Details  Name: Denise Walsh MRN: [id number] Date of Birth: 07/06/1951 Referring Provider (PT): Dagmar Hait   Encounter Date: 11/28/2018  PT End of Session - 11/28/18 0803    Visit Number  9    Date for PT Re-Evaluation  01/01/19    PT Start Time  0801    PT Stop Time  8978    PT Time Calculation (min)  41 min    Activity Tolerance  Patient tolerated treatment well;No increased pain    Behavior During Therapy  WFL for tasks assessed/performed       Past Medical History:  Diagnosis Date  . Asthma   . Osteoporosis     No past surgical history on file.  There were no vitals filed for this visit.  Subjective Assessment - 11/28/18 0805    Subjective  Patient reports she fell on Saturday in the yard. There was a loose brick and she stepped on it. She fell on her right side and is sore.    Patient Stated Goals  be stronger, have less stumbles    Currently in Pain?  No/denies                       [facility] Adult PT Treatment/Exercise - 11/28/18 0001      Therapeutic Activites    Therapeutic Activities  ADL's    ADL's  floor to stand transfers x 3      Knee/Hip Exercises: Aerobic   Elliptical  I11 R7 2 fwd/ 2 bak    Nustep  L6 x 4 min       Knee/Hip Exercises: Machines for Strengthening   Cybex Knee Extension  5# SL 2x10    Cybex Knee Flexion  15#  SL 2x10    Cybex Leg Press  40lb x15, SL 20lb x10 each     Other Machine  seated row 25#, 2x15, lats 20#, 20# chest press 10# all 3x10      Knee/Hip Exercises: Standing   SLS  bil x 30 sec each; then SLS with reach x 5 ea    Other Standing Knee Exercises  split squats one hand on counter 1 x10, 1x5 bil               PT Short Term Goals - 11/11/18 4784      PT SHORT TERM GOAL #1   Title  I with inial HEP     Status  Achieved        PT Long Term Goals - 11/28/18 0845      PT LONG TERM GOAL #1   Title  Patient able to perform functional squats and lunges with full ROM and strength.    Time  8    Period  Weeks    Status  Partially Met      PT LONG TERM GOAL #2   Title  Patient able to transfer from floor to standing without difficulty.    Time  8    Period  Weeks    Status  Achieved      PT LONG TERM GOAL #3   Title  Improved SLS time to 10 sec bilaterally to improve balance.    Baseline  30 sec    Time  8    Period  Weeks  Status  Achieved      PT LONG TERM GOAL #5   Title  patient to demo 5/5 R knee and hip strength to improve function.    Time  8    Period  Weeks    Status  Achieved            Plan - 11/28/18 0847    Clinical Impression Statement  Patient did very well today with TE. She was able to do split squat with one hand assist for balance. She reported some soreness in right shoulder with lat pull and chest press subsequent to her fall on Saturday.     PT Treatment/Interventions  ADLs/Self Care Home Management;Functional mobility training;Therapeutic activities;Therapeutic exercise;Patient/family education;Balance training;Neuromuscular re-education    PT Next Visit Plan  D/C to HEP; Full body functional strenght training    PT Home Exercise Plan  Sit to stands, split squats       Patient will benefit from skilled therapeutic intervention in order to improve the following deficits and impairments:  Postural dysfunction, Decreased strength, Decreased balance, Impaired flexibility  Visit Diagnosis: Muscle weakness (generalized)  Unsteadiness on feet     Problem List There are no active problems to display for this patient.   Madelyn Flavors PT 11/28/2018, 8:51 AM  [place] [address] [facility] [place], [place], [address] Phone: [phone]   Fax:  [phone]  Name: Denise Walsh MRN:  [id number] Date of Birth: Oct 26, 1951

## 2018-11-29 ENCOUNTER — Ambulatory Visit: Payer: Medicare Other | Admitting: Physical Therapy

## 2018-12-02 ENCOUNTER — Ambulatory Visit: Payer: Medicare Other | Admitting: Physical Therapy

## 2018-12-02 ENCOUNTER — Encounter: Payer: Self-pay | Admitting: Physical Therapy

## 2018-12-02 DIAGNOSIS — R2681 Unsteadiness on feet: Secondary | ICD-10-CM

## 2018-12-02 DIAGNOSIS — M6281 Muscle weakness (generalized): Secondary | ICD-10-CM

## 2018-12-02 DIAGNOSIS — R600 Localized edema: Secondary | ICD-10-CM

## 2018-12-02 NOTE — Therapy (Signed)
[address] [place], [place], [address] Phone: [phone]   Fax:  [phone] Progress Note Reporting Period 11/01/18 to 12/02/18 for the first 10 visits  See note below for Objective Data and Assessment of Progress/Goals.      Physical Therapy Treatment  Patient Details  Name: Denise Walsh MRN: [id number] Date of Birth: 20-Oct-1951 Referring Provider (PT): Dagmar Hait   Encounter Date: 12/02/2018  PT End of Session - 12/02/18 0843    Visit Number  10    Date for PT Re-Evaluation  01/01/19    PT Start Time  0800    PT Stop Time  0843    PT Time Calculation (min)  43 min    Activity Tolerance  Patient tolerated treatment well;No increased pain    Behavior During Therapy  WFL for tasks assessed/performed       Past Medical History:  Diagnosis Date  . Asthma   . Osteoporosis     History reviewed. No pertinent surgical history.  There were no vitals filed for this visit.  Subjective Assessment - 12/02/18 0805    Subjective  "I feel all right"    Currently in Pain?  No/denies                       [facility] Adult PT Treatment/Exercise - 12/02/18 0001      Lumbar Exercises: Aerobic   Elliptical  I13 R 7 x3 each way       Lumbar Exercises: Standing   Other Standing Lumbar Exercises  RDL 12lb 2x10    Other Standing Lumbar Exercises  Thrusters yellow ball 2x10       Lumbar Exercises: Seated   Other Seated Lumbar Exercises  Bottom loaded squats 10lb 2x10       Knee/Hip Exercises: Machines for Strengthening   Cybex Leg Press  40lb 3x10    Other Machine  seated row 35# 2x10, lats 25#, 20# chest press 10# all 3x10      Knee/Hip Exercises: Standing   Heel Raises  Both;2 sets;15 reps;2 seconds    Forward Step Up  Both;5 reps;Hand Hold: 0;Step Height: 8";3 sets   holding 4lb dumbbells   Other Standing Knee Exercises  split squats one hand on counter 1 x10, 1x5 bil                PT Short Term Goals - 11/11/18 3299      PT SHORT TERM GOAL #1   Title  I with inial HEP     Status  Achieved        PT Long Term Goals - 12/02/18 0820      PT LONG TERM GOAL #1   Title  Patient able to perform functional squats and lunges with full ROM and strength.    Status  Partially Met      PT LONG TERM GOAL #4   Title  independent and safe with gym or advanced HEP      PT LONG TERM GOAL #5   Title  patient to demo 5/5 R knee and hip strength to improve function.    Status  Achieved            Plan - 12/02/18 0843    Clinical Impression Statement  Overall pt is doing well with good strength and ROM. She does have some weakness and instability with split squats. No reports of increase pain. Progressed with  all functional activity. Postural corrections needed with squatting activities.     Rehab Potential  Good    PT Frequency  2x / week    PT Duration  8 weeks    PT Treatment/Interventions  ADLs/Self Care Home Management;Functional mobility training;Therapeutic activities;Therapeutic exercise;Patient/family education;Balance training;Neuromuscular re-education    PT Next Visit Plan  One more app to make better transition to GYM       Patient will benefit from skilled therapeutic intervention in order to improve the following deficits and impairments:  Postural dysfunction, Decreased strength, Decreased balance, Impaired flexibility  Visit Diagnosis: Muscle weakness (generalized)  Unsteadiness on feet  Localized edema     Problem List There are no active problems to display for this patient.   Scot Jun, PTA 12/02/2018, 8:48 AM  [place] [address] [place], [place], [address] Phone: [phone]   Fax:  [phone]  Name: Emmamarie Kluender MRN: [id number] Date of Birth: [birth year]

## 2018-12-15 ENCOUNTER — Encounter: Payer: Self-pay | Admitting: Physical Therapy

## 2018-12-15 ENCOUNTER — Ambulatory Visit: Payer: Medicare Other | Attending: Internal Medicine | Admitting: Physical Therapy

## 2018-12-15 DIAGNOSIS — R2681 Unsteadiness on feet: Secondary | ICD-10-CM | POA: Diagnosis not present

## 2018-12-15 DIAGNOSIS — R600 Localized edema: Secondary | ICD-10-CM | POA: Diagnosis not present

## 2018-12-15 DIAGNOSIS — M6281 Muscle weakness (generalized): Secondary | ICD-10-CM

## 2018-12-15 NOTE — Therapy (Signed)
[address] [place], [place], [address] Phone: [phone]   Fax:  [phone]  Physical Therapy Treatment  Patient Details  Name: Denise Walsh MRN: [id number] Date of Birth: Sep 12, 1951 Referring Provider (PT): Dagmar Hait   Encounter Date: 12/15/2018  PT End of Session - 12/15/18 0842    Visit Number  11    Date for PT Re-Evaluation  01/01/19    PT Start Time  0800    PT Stop Time  5809    PT Time Calculation (min)  42 min    Activity Tolerance  Patient tolerated treatment well;No increased pain    Behavior During Therapy  WFL for tasks assessed/performed       Past Medical History:  Diagnosis Date  . Asthma   . Osteoporosis     History reviewed. No pertinent surgical history.  There were no vitals filed for this visit.  Subjective Assessment - 12/15/18 0803    Subjective  Pt reports that she is doing really good    Currently in Pain?  No/denies                       [facility] Adult PT Treatment/Exercise - 12/15/18 0001      Lumbar Exercises: Aerobic   Elliptical  I12 R 6 x3 each way     UBE (Upper Arm Bike)  L4 x2 min each way      Lumbar Exercises: Standing   Other Standing Lumbar Exercises  straight arm pull downs 5lb x10    Other Standing Lumbar Exercises  Thrusters yellow ball x15       Lumbar Exercises: Seated   Sit to Stand  20 reps   SA OHP 4lb each side for 10     Knee/Hip Exercises: Machines for Strengthening   Cybex Knee Extension  15#  2x5    Cybex Knee Flexion  35# 2x5, 25lb x10     Cybex Leg Press  50lb 3x10    Other Machine  seated row 35# 2x10, lats 35# 2x5; chest press 15# all 3x10      Knee/Hip Exercises: Standing   Heel Raises  Both;2 sets;15 reps;2 seconds    Forward Step Up  Both;2 sets;10 reps;Hand Hold: 0;Step Height: 6"   holding 4lb dumbbells               PT Short Term Goals - 11/11/18 9833      PT SHORT TERM GOAL #1   Title   I with inial HEP     Status  Achieved        PT Long Term Goals - 12/15/18 8250      PT LONG TERM GOAL #1   Title  Patient able to perform functional squats and lunges with full ROM and strength.    Status  Partially Met      PT LONG TERM GOAL #3   Title  Improved SLS time to 10 sec bilaterally to improve balance.    Status  Achieved      PT LONG TERM GOAL #4   Title  independent and safe with gym or advanced HEP    Status  Partially Met      PT LONG TERM GOAL #5   Title  patient to demo 5/5 R knee and hip strength to improve function.    Status  Achieved  Plan - 12/15/18 0842    Clinical Impression Statement  Most goals met, pt is pleased with her current functional status and reports no functional limitation. She reports compliance with her HEP.    Rehab Potential  Good    PT Next Visit Plan  D/C PT       Patient will benefit from skilled therapeutic intervention in order to improve the following deficits and impairments:  Postural dysfunction, Decreased strength, Decreased balance, Impaired flexibility  Visit Diagnosis: Unsteadiness on feet  Muscle weakness (generalized)  Localized edema     Problem List There are no active problems to display for this patient.   PHYSICAL THERAPY DISCHARGE SUMMARY  Visits from Start of Care: 11  Plan: Patient agrees to discharge.  Patient goals were not met. Patient is being discharged due to being pleased with the current functional level.  ?????        Scot Jun, PTA 12/15/2018, 8:44 AM  [place] [address] [place] , [place], [address] Phone: [phone]   Fax:  [phone]  Name: Denise Walsh MRN: [id number] Date of Birth: [birth year]

## 2018-12-16 ENCOUNTER — Encounter: Payer: Medicare Other | Admitting: Physical Therapy

## 2019-04-08 DIAGNOSIS — Z1231 Encounter for screening mammogram for malignant neoplasm of breast: Secondary | ICD-10-CM | POA: Diagnosis not present

## 2019-06-12 DIAGNOSIS — J4530 Mild persistent asthma, uncomplicated: Secondary | ICD-10-CM | POA: Diagnosis not present

## 2019-06-12 DIAGNOSIS — M810 Age-related osteoporosis without current pathological fracture: Secondary | ICD-10-CM | POA: Diagnosis not present

## 2019-06-12 DIAGNOSIS — H60543 Acute eczematoid otitis externa, bilateral: Secondary | ICD-10-CM | POA: Diagnosis not present

## 2019-06-12 DIAGNOSIS — E559 Vitamin D deficiency, unspecified: Secondary | ICD-10-CM | POA: Diagnosis not present

## 2019-06-12 DIAGNOSIS — J309 Allergic rhinitis, unspecified: Secondary | ICD-10-CM | POA: Diagnosis not present

## 2019-06-28 DIAGNOSIS — Z87442 Personal history of urinary calculi: Secondary | ICD-10-CM | POA: Diagnosis not present

## 2019-06-28 DIAGNOSIS — L658 Other specified nonscarring hair loss: Secondary | ICD-10-CM | POA: Diagnosis not present

## 2019-06-28 DIAGNOSIS — E559 Vitamin D deficiency, unspecified: Secondary | ICD-10-CM | POA: Diagnosis not present

## 2019-06-28 DIAGNOSIS — E65 Localized adiposity: Secondary | ICD-10-CM | POA: Diagnosis not present

## 2019-06-28 DIAGNOSIS — Z87891 Personal history of nicotine dependence: Secondary | ICD-10-CM | POA: Diagnosis not present

## 2019-06-28 DIAGNOSIS — L680 Hirsutism: Secondary | ICD-10-CM | POA: Diagnosis not present

## 2019-06-28 DIAGNOSIS — J4530 Mild persistent asthma, uncomplicated: Secondary | ICD-10-CM | POA: Diagnosis not present

## 2019-06-28 DIAGNOSIS — M810 Age-related osteoporosis without current pathological fracture: Secondary | ICD-10-CM | POA: Diagnosis not present

## 2019-06-28 DIAGNOSIS — Z131 Encounter for screening for diabetes mellitus: Secondary | ICD-10-CM | POA: Diagnosis not present

## 2019-06-28 DIAGNOSIS — J309 Allergic rhinitis, unspecified: Secondary | ICD-10-CM | POA: Diagnosis not present

## 2019-07-07 ENCOUNTER — Other Ambulatory Visit: Payer: Self-pay | Admitting: Family Medicine

## 2019-07-07 DIAGNOSIS — M810 Age-related osteoporosis without current pathological fracture: Secondary | ICD-10-CM

## 2019-07-30 DIAGNOSIS — Z23 Encounter for immunization: Secondary | ICD-10-CM | POA: Diagnosis not present

## 2019-09-22 ENCOUNTER — Other Ambulatory Visit: Payer: Medicare Other

## 2019-09-22 DIAGNOSIS — H9201 Otalgia, right ear: Secondary | ICD-10-CM | POA: Diagnosis not present

## 2019-12-05 ENCOUNTER — Other Ambulatory Visit: Payer: Self-pay | Admitting: Family Medicine

## 2019-12-05 DIAGNOSIS — L430 Hypertrophic lichen planus: Secondary | ICD-10-CM | POA: Diagnosis not present

## 2019-12-05 DIAGNOSIS — D485 Neoplasm of uncertain behavior of skin: Secondary | ICD-10-CM | POA: Diagnosis not present

## 2019-12-11 ENCOUNTER — Ambulatory Visit: Payer: Medicare Other | Attending: Internal Medicine

## 2019-12-11 DIAGNOSIS — Z23 Encounter for immunization: Secondary | ICD-10-CM | POA: Insufficient documentation

## 2019-12-11 NOTE — Progress Notes (Signed)
   Covid-19 Vaccination Clinic  Name:  Denise Walsh    MRN: [id number] DOB: 11/23/1950  12/11/2019  Denise Walsh was observed post Covid-19 immunization for 15 minutes without incidence. She was provided with Vaccine Information Sheet and instruction to access the V-Safe system.   Denise Walsh was instructed to call 911 with any severe reactions post vaccine: Marland Kitchen Difficulty breathing  . Swelling of your face and throat  . A fast heartbeat  . A bad rash all over your body  . Dizziness and weakness    Immunizations Administered    Name Date Dose VIS Date Route   Pfizer COVID-19 Vaccine 12/11/2019  6:18 PM 0.3 mL 10/13/2019 Intramuscular   Manufacturer: [place]   Lot: [id number]   [place]: [id number]

## 2019-12-15 DIAGNOSIS — Z0000 Encounter for general adult medical examination without abnormal findings: Secondary | ICD-10-CM | POA: Diagnosis not present

## 2019-12-15 DIAGNOSIS — J4530 Mild persistent asthma, uncomplicated: Secondary | ICD-10-CM | POA: Diagnosis not present

## 2019-12-15 DIAGNOSIS — Z1211 Encounter for screening for malignant neoplasm of colon: Secondary | ICD-10-CM | POA: Diagnosis not present

## 2019-12-15 DIAGNOSIS — E559 Vitamin D deficiency, unspecified: Secondary | ICD-10-CM | POA: Diagnosis not present

## 2019-12-15 DIAGNOSIS — F419 Anxiety disorder, unspecified: Secondary | ICD-10-CM | POA: Diagnosis not present

## 2019-12-15 DIAGNOSIS — M810 Age-related osteoporosis without current pathological fracture: Secondary | ICD-10-CM | POA: Diagnosis not present

## 2019-12-15 DIAGNOSIS — J309 Allergic rhinitis, unspecified: Secondary | ICD-10-CM | POA: Diagnosis not present

## 2020-01-06 ENCOUNTER — Ambulatory Visit: Payer: Medicare Other | Attending: Internal Medicine

## 2020-01-06 DIAGNOSIS — Z23 Encounter for immunization: Secondary | ICD-10-CM

## 2020-01-06 NOTE — Progress Notes (Signed)
   Covid-19 Vaccination Clinic  Name:  Denise Walsh    MRN: [id number] DOB: 12-30-1950  01/06/2020  Denise Walsh was observed post Covid-19 immunization for 15 minutes without incident. She was provided with Vaccine Information Sheet and instruction to access the V-Safe system.   Denise Walsh was instructed to call 911 with any severe reactions post vaccine: Marland Kitchen Difficulty breathing  . Swelling of face and throat  . A fast heartbeat  . A bad rash all over body  . Dizziness and weakness   Immunizations Administered    Name Date Dose VIS Date Route   Pfizer COVID-19 Vaccine 01/06/2020 12:05 PM 0.3 mL 10/13/2019 Intramuscular   Manufacturer: [place]   Lot: [id number]   [place]: [id number]

## 2020-03-05 DIAGNOSIS — M810 Age-related osteoporosis without current pathological fracture: Secondary | ICD-10-CM | POA: Diagnosis not present

## 2020-03-13 ENCOUNTER — Other Ambulatory Visit: Payer: Medicare Other

## 2020-04-02 DIAGNOSIS — M810 Age-related osteoporosis without current pathological fracture: Secondary | ICD-10-CM | POA: Diagnosis not present

## 2020-04-02 DIAGNOSIS — K219 Gastro-esophageal reflux disease without esophagitis: Secondary | ICD-10-CM | POA: Diagnosis not present

## 2020-04-02 DIAGNOSIS — Z87442 Personal history of urinary calculi: Secondary | ICD-10-CM | POA: Diagnosis not present

## 2020-04-08 DIAGNOSIS — Z1231 Encounter for screening mammogram for malignant neoplasm of breast: Secondary | ICD-10-CM | POA: Diagnosis not present

## 2020-07-27 DIAGNOSIS — Z23 Encounter for immunization: Secondary | ICD-10-CM | POA: Diagnosis not present

## 2020-08-10 DIAGNOSIS — Z23 Encounter for immunization: Secondary | ICD-10-CM | POA: Diagnosis not present

## 2021-02-22 DIAGNOSIS — Z23 Encounter for immunization: Secondary | ICD-10-CM | POA: Diagnosis not present

## 2021-02-22 DIAGNOSIS — H2513 Age-related nuclear cataract, bilateral: Secondary | ICD-10-CM | POA: Diagnosis not present

## 2021-02-28 DIAGNOSIS — Z136 Encounter for screening for cardiovascular disorders: Secondary | ICD-10-CM | POA: Diagnosis not present

## 2021-02-28 DIAGNOSIS — Z1389 Encounter for screening for other disorder: Secondary | ICD-10-CM | POA: Diagnosis not present

## 2021-02-28 DIAGNOSIS — Z0000 Encounter for general adult medical examination without abnormal findings: Secondary | ICD-10-CM | POA: Diagnosis not present

## 2021-02-28 DIAGNOSIS — Z131 Encounter for screening for diabetes mellitus: Secondary | ICD-10-CM | POA: Diagnosis not present

## 2021-02-28 DIAGNOSIS — M810 Age-related osteoporosis without current pathological fracture: Secondary | ICD-10-CM | POA: Diagnosis not present

## 2021-03-03 DIAGNOSIS — Z1211 Encounter for screening for malignant neoplasm of colon: Secondary | ICD-10-CM | POA: Diagnosis not present

## 2021-03-13 DIAGNOSIS — H60543 Acute eczematoid otitis externa, bilateral: Secondary | ICD-10-CM | POA: Diagnosis not present

## 2021-03-13 DIAGNOSIS — F419 Anxiety disorder, unspecified: Secondary | ICD-10-CM | POA: Diagnosis not present

## 2021-03-13 DIAGNOSIS — M810 Age-related osteoporosis without current pathological fracture: Secondary | ICD-10-CM | POA: Diagnosis not present

## 2021-03-13 DIAGNOSIS — E782 Mixed hyperlipidemia: Secondary | ICD-10-CM | POA: Diagnosis not present

## 2021-03-13 DIAGNOSIS — M79641 Pain in right hand: Secondary | ICD-10-CM | POA: Diagnosis not present

## 2021-03-13 DIAGNOSIS — E559 Vitamin D deficiency, unspecified: Secondary | ICD-10-CM | POA: Diagnosis not present

## 2021-03-13 DIAGNOSIS — J309 Allergic rhinitis, unspecified: Secondary | ICD-10-CM | POA: Diagnosis not present

## 2021-03-13 DIAGNOSIS — Z23 Encounter for immunization: Secondary | ICD-10-CM | POA: Diagnosis not present

## 2021-03-13 DIAGNOSIS — J4530 Mild persistent asthma, uncomplicated: Secondary | ICD-10-CM | POA: Diagnosis not present

## 2021-03-13 DIAGNOSIS — Z01419 Encounter for gynecological examination (general) (routine) without abnormal findings: Secondary | ICD-10-CM | POA: Diagnosis not present

## 2021-03-14 ENCOUNTER — Other Ambulatory Visit: Payer: Self-pay | Admitting: Family Medicine

## 2021-03-14 DIAGNOSIS — E782 Mixed hyperlipidemia: Secondary | ICD-10-CM

## 2021-03-27 DIAGNOSIS — L738 Other specified follicular disorders: Secondary | ICD-10-CM | POA: Diagnosis not present

## 2021-03-27 DIAGNOSIS — D1721 Benign lipomatous neoplasm of skin and subcutaneous tissue of right arm: Secondary | ICD-10-CM | POA: Diagnosis not present

## 2021-04-01 DIAGNOSIS — H2512 Age-related nuclear cataract, left eye: Secondary | ICD-10-CM | POA: Diagnosis not present

## 2021-04-01 DIAGNOSIS — H25043 Posterior subcapsular polar age-related cataract, bilateral: Secondary | ICD-10-CM | POA: Diagnosis not present

## 2021-04-01 DIAGNOSIS — H25013 Cortical age-related cataract, bilateral: Secondary | ICD-10-CM | POA: Diagnosis not present

## 2021-04-01 DIAGNOSIS — H18413 Arcus senilis, bilateral: Secondary | ICD-10-CM | POA: Diagnosis not present

## 2021-04-01 DIAGNOSIS — H2513 Age-related nuclear cataract, bilateral: Secondary | ICD-10-CM | POA: Diagnosis not present

## 2021-04-02 DIAGNOSIS — M810 Age-related osteoporosis without current pathological fracture: Secondary | ICD-10-CM | POA: Diagnosis not present

## 2021-04-02 DIAGNOSIS — K219 Gastro-esophageal reflux disease without esophagitis: Secondary | ICD-10-CM | POA: Diagnosis not present

## 2021-04-02 DIAGNOSIS — Z87442 Personal history of urinary calculi: Secondary | ICD-10-CM | POA: Diagnosis not present

## 2021-04-08 ENCOUNTER — Ambulatory Visit
Admission: RE | Admit: 2021-04-08 | Discharge: 2021-04-08 | Disposition: A | Discharge status: Home / Self Care | Payer: Medicare Other | Source: Ambulatory Visit | Attending: Family Medicine | Admitting: Family Medicine

## 2021-04-08 DIAGNOSIS — E785 Hyperlipidemia, unspecified: Secondary | ICD-10-CM | POA: Diagnosis not present

## 2021-04-08 DIAGNOSIS — E782 Mixed hyperlipidemia: Secondary | ICD-10-CM

## 2021-04-14 DIAGNOSIS — Z1231 Encounter for screening mammogram for malignant neoplasm of breast: Secondary | ICD-10-CM | POA: Diagnosis not present

## 2021-05-05 DIAGNOSIS — R519 Headache, unspecified: Secondary | ICD-10-CM | POA: Diagnosis not present

## 2021-05-05 DIAGNOSIS — R0981 Nasal congestion: Secondary | ICD-10-CM | POA: Diagnosis not present

## 2021-05-05 DIAGNOSIS — U071 COVID-19: Secondary | ICD-10-CM | POA: Diagnosis not present

## 2021-05-27 DIAGNOSIS — D1721 Benign lipomatous neoplasm of skin and subcutaneous tissue of right arm: Secondary | ICD-10-CM | POA: Diagnosis not present

## 2021-05-27 DIAGNOSIS — D485 Neoplasm of uncertain behavior of skin: Secondary | ICD-10-CM | POA: Diagnosis not present

## 2021-07-01 ENCOUNTER — Other Ambulatory Visit: Payer: Self-pay

## 2021-07-01 ENCOUNTER — Telehealth: Payer: Self-pay | Admitting: Neurology

## 2021-07-01 ENCOUNTER — Ambulatory Visit (INDEPENDENT_AMBULATORY_CARE_PROVIDER_SITE_OTHER): Payer: Medicare Other | Admitting: Neurology

## 2021-07-01 ENCOUNTER — Encounter: Payer: Self-pay | Admitting: Neurology

## 2021-07-01 VITALS — BP 118/70 | HR 56 | Ht 63.0 in | Wt 140.6 lb

## 2021-07-01 DIAGNOSIS — G248 Other dystonia: Secondary | ICD-10-CM | POA: Diagnosis not present

## 2021-07-01 NOTE — Telephone Encounter (Signed)
Medicare/aetna supp no auth require order faxed to [facility] they will reach out to the patient to schedule.

## 2021-07-01 NOTE — Progress Notes (Signed)
Chief Complaint  Patient presents with   New Patient (Initial Visit)    New room. Reports intermittent right hand cramping, worse when typing or playing the piano.       ASSESSMENT AND PLAN  Denise Walsh is a 70 y.o. 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   MEDICAL HISTORY:  Denise Walsh, is a 70 year old female, seen in request by her primary care physician Dr. Cari Caraway for evaluation of right hand muscle cramping, initial evaluation was on July 01, 2021,  I reviewed and summarized the referring note. PMHX.  She is a Scientist, research (physical sciences), spend a lot of time typing, also played piano for many years, she used to practice piano on a daily basis, in 2021, she noticed while she was typing, or playing piano, she tends to raise her right fifth finger, abduction, extension, then she also developed uncontrollable right index finger flexor at metaphalangeal and distal PIP joints, it happened on extending few minutes, make it very difficult for her,  She has to hold her hand into her hands, while typing on the keyboard, she denies sensory loss, denies weakness, she has to take a break from playing piano,   PHYSICAL EXAM:   Vitals:   07/01/21 0946  BP: 118/70  Pulse: (!) 56  Weight: 140 lb 9.6 oz (63.8 kg)  Height: '5\' 3"'$  (1.6 m)   Not recorded     Body mass index is 24.91 kg/m.  PHYSICAL EXAMNIATION:  Gen: NAD, conversant, well nourised, well groomed                     Cardiovascular: Regular rate rhythm, no peripheral edema, warm, nontender. Eyes: Conjunctivae clear without exudates or  hemorrhage Neck: Supple, no carotid bruits. Pulmonary: Clear to auscultation bilaterally   NEUROLOGICAL EXAM:  MENTAL STATUS: Speech:    Speech is normal; fluent and spontaneous with normal comprehension.  Cognition:     Orientation to time, place and person     Normal recent and remote memory     Normal Attention span and concentration     Normal Language, naming, repeating,spontaneous speech     Fund of knowledge   CRANIAL NERVES: CN II: Visual fields are full to confrontation. Pupils are round equal and briskly reactive to light. CN III, IV, VI: extraocular movement are normal. No ptosis. CN V: Facial sensation is intact to light touch CN VII: Face is symmetric with normal eye closure  CN VIII: Hearing is normal to causal conversation. CN IX, X: Phonation is normal. CN XI: Head turning and shoulder shrug are intact  MOTOR: With mimic typing motion, she has right index finger flexion, left fifth finger extension and abduction,  REFLEXES: Reflexes are 2+ and symmetric at the biceps, triceps, knees, and ankles. Plantar responses are flexor.  SENSORY: Intact to light touch, pinprick and vibratory sensation are intact in fingers and toes.  COORDINATION: There is no trunk or limb dysmetria noted.  GAIT/STANCE: Posture is normal. Gait is steady with normal steps, base, arm swing, and turning. Heel and toe walking are normal. Tandem gait is normal.  Romberg is absent.  REVIEW  OF SYSTEMS:  Full 14 system review of systems performed and notable only for as above All other review of systems were negative.   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   Codeine Nausea Only    HOME MEDICATIONS: Current Outpatient Medications  Medication Sig Dispense Refill   albuterol (VENTOLIN HFA) 108  (90 Base) MCG/ACT inhaler Inhale 2 puffs into the lungs as needed.     alendronate (FOSAMAX) 10 MG tablet Take 10 mg by mouth daily.     Biotin w/ Vitamins C & E (HAIR/SKIN/NAILS PO) Take 1 tablet by mouth daily.     FLOVENT HFA 110 MCG/ACT inhaler Inhale 1 puff into the lungs as needed.     loratadine (CLARITIN) 10 MG tablet Take 10 mg by mouth at bedtime.     Multiple Vitamin (MULTIVITAMIN) tablet Take 1 tablet by mouth daily.     vitamin C (ASCORBIC ACID) 500 MG tablet Take 500 mg by mouth daily.     VITAMIN D PO Take 1,000 Units by mouth daily.     No current facility-administered medications for this visit.    PAST MEDICAL HISTORY: Past Medical History:  Diagnosis Date   Asthma    Fibroids    History of kidney stones    Ocular migraine    "one event many years ago"   Osteoarthritis    Osteoporosis    Right hand pain    Vitamin D deficiency     PAST SURGICAL HISTORY: Past Surgical History:  Procedure Laterality Date   BIOPSY BREAST Left    benign   DILATION AND CURETTAGE OF UTERUS     x 2   FIBROID TUMOR REMOVED FROM UTERUS     x 3    FAMILY HISTORY: Family History  Problem Relation Age of Onset   Hyperlipidemia Mother    Hypertension Mother    Congestive Heart Failure Mother    Dementia Mother    Cancer Mother        laryngeal   Alcoholism Mother    Hyperlipidemia Father    Hypertension Father    Dementia Father    Anxiety disorder Father     SOCIAL HISTORY: Social History   Socioeconomic History   Marital status: Married    Spouse name: Not on file   Number of children: 2   Years of education: college   Highest education level: Not on file  Occupational History   Occupation: Engineer, maintenance (IT)  Tobacco Use   Smoking status: Former    Types: Cigarettes   Smokeless tobacco: Never  Substance and Sexual Activity   Alcohol use: Not Currently   Drug use: Never   Sexual activity: Not on file  Other Topics Concern   Not on file  Social History Narrative    Lives at home with husband.   Right-handed.   No daily caffeine per day.   Social Determinants of Health   Financial Resource Strain: Not on file  Food Insecurity: Not on file  Transportation Needs: Not on file  Physical Activity: Not on file  Stress: Not on file  Social Connections: Not on file  Intimate Partner Violence: Not on file      Marcial Pacas, M.D. Ph.D.  [facility] [address] [place],  [place] [address] Ph: [phone] Fax: [phone]  CC:  Cari Caraway, MD [place],  [place] [id number]  Cari Caraway, MD

## 2021-07-01 NOTE — Telephone Encounter (Signed)
Patient wants open MRI

## 2021-07-14 DIAGNOSIS — H2512 Age-related nuclear cataract, left eye: Secondary | ICD-10-CM | POA: Diagnosis not present

## 2021-07-14 DIAGNOSIS — H2513 Age-related nuclear cataract, bilateral: Secondary | ICD-10-CM | POA: Diagnosis not present

## 2021-07-14 DIAGNOSIS — H52202 Unspecified astigmatism, left eye: Secondary | ICD-10-CM | POA: Diagnosis not present

## 2021-07-15 DIAGNOSIS — H2511 Age-related nuclear cataract, right eye: Secondary | ICD-10-CM | POA: Diagnosis not present

## 2021-07-17 ENCOUNTER — Emergency Department (HOSPITAL_BASED_OUTPATIENT_CLINIC_OR_DEPARTMENT_OTHER): Payer: Medicare Other

## 2021-07-17 ENCOUNTER — Encounter (HOSPITAL_BASED_OUTPATIENT_CLINIC_OR_DEPARTMENT_OTHER): Payer: Self-pay | Admitting: Emergency Medicine

## 2021-07-17 ENCOUNTER — Emergency Department (HOSPITAL_BASED_OUTPATIENT_CLINIC_OR_DEPARTMENT_OTHER)
Admission: EM | Admit: 2021-07-17 | Discharge: 2021-07-17 | Disposition: A | Discharge status: Home / Self Care | Payer: Medicare Other | Attending: Emergency Medicine | Admitting: Emergency Medicine

## 2021-07-17 ENCOUNTER — Other Ambulatory Visit: Payer: Self-pay

## 2021-07-17 DIAGNOSIS — N200 Calculus of kidney: Secondary | ICD-10-CM | POA: Insufficient documentation

## 2021-07-17 DIAGNOSIS — N1330 Unspecified hydronephrosis: Secondary | ICD-10-CM | POA: Diagnosis not present

## 2021-07-17 DIAGNOSIS — N134 Hydroureter: Secondary | ICD-10-CM | POA: Diagnosis not present

## 2021-07-17 DIAGNOSIS — Z91040 Latex allergy status: Secondary | ICD-10-CM | POA: Insufficient documentation

## 2021-07-17 DIAGNOSIS — R109 Unspecified abdominal pain: Secondary | ICD-10-CM | POA: Diagnosis not present

## 2021-07-17 DIAGNOSIS — J45909 Unspecified asthma, uncomplicated: Secondary | ICD-10-CM | POA: Diagnosis not present

## 2021-07-17 DIAGNOSIS — R112 Nausea with vomiting, unspecified: Secondary | ICD-10-CM | POA: Diagnosis not present

## 2021-07-17 DIAGNOSIS — Z87891 Personal history of nicotine dependence: Secondary | ICD-10-CM | POA: Insufficient documentation

## 2021-07-17 LAB — CBC WITH DIFFERENTIAL/PLATELET
Abs Immature Granulocytes: 0.04 10*3/uL (ref 0.00–0.07)
Basophils Absolute: 0.1 10*3/uL (ref 0.0–0.1)
Basophils Relative: 1 %
Eosinophils Absolute: 0 10*3/uL (ref 0.0–0.5)
Eosinophils Relative: 0 %
HCT: 37.6 % (ref 36.0–46.0)
Hemoglobin: 12.2 g/dL (ref 12.0–15.0)
Immature Granulocytes: 0 %
Lymphocytes Relative: 10 %
Lymphs Abs: 1 10*3/uL (ref 0.7–4.0)
MCH: 29.6 pg (ref 26.0–34.0)
MCHC: 32.4 g/dL (ref 30.0–36.0)
MCV: 91.3 fL (ref 80.0–100.0)
Monocytes Absolute: 0.4 10*3/uL (ref 0.1–1.0)
Monocytes Relative: 4 %
Neutro Abs: 8.4 10*3/uL — ABNORMAL HIGH (ref 1.7–7.7)
Neutrophils Relative %: 85 %
Platelets: 192 10*3/uL (ref 150–400)
RBC: 4.12 MIL/uL (ref 3.87–5.11)
RDW: 13.9 % (ref 11.5–15.5)
WBC: 9.9 10*3/uL (ref 4.0–10.5)
nRBC: 0 % (ref 0.0–0.2)

## 2021-07-17 LAB — COMPREHENSIVE METABOLIC PANEL
ALT: 14 U/L (ref 0–44)
AST: 16 U/L (ref 15–41)
Albumin: 4.3 g/dL (ref 3.5–5.0)
Alkaline Phosphatase: 55 U/L (ref 38–126)
Anion gap: 11 (ref 5–15)
BUN: 21 mg/dL (ref 8–23)
CO2: 26 mmol/L (ref 22–32)
Calcium: 9.5 mg/dL (ref 8.9–10.3)
Chloride: 100 mmol/L (ref 98–111)
Creatinine, Ser: 1 mg/dL (ref 0.44–1.00)
GFR, Estimated: >60 mL/min (ref >60)
Glucose, Bld: 110 mg/dL — ABNORMAL HIGH (ref 70–99)
Potassium: 4 mmol/L (ref 3.5–5.1)
Sodium: 137 mmol/L (ref 135–145)
Total Bilirubin: 0.3 mg/dL (ref 0.3–1.2)
Total Protein: 6.9 g/dL (ref 6.5–8.1)

## 2021-07-17 LAB — URINALYSIS, ROUTINE W REFLEX MICROSCOPIC
Bilirubin Urine: NEGATIVE
Glucose, UA: NEGATIVE mg/dL
Hgb urine dipstick: NEGATIVE
Ketones, ur: NEGATIVE mg/dL
Leukocytes,Ua: NEGATIVE
Nitrite: NEGATIVE
Protein, ur: NEGATIVE mg/dL
Specific Gravity, Urine: 1.025 (ref 1.005–1.030)
pH: 5 (ref 5.0–8.0)

## 2021-07-17 LAB — LIPASE, BLOOD: Lipase: 14 U/L (ref 11–51)

## 2021-07-17 MED ORDER — FENTANYL CITRATE PF 50 MCG/ML IJ SOSY
50.0000 ug | PREFILLED_SYRINGE | Freq: Once | INTRAMUSCULAR | Status: AC
  Administered 2021-07-17: 50 ug via INTRAVENOUS
  Filled 2021-07-17: qty 1

## 2021-07-17 NOTE — ED Triage Notes (Signed)
Left side flank pain awoke patient at 0400. Severe n/v also. Pt was seen by primary ,received zofran and no nausea now. Pt was told to come to ed.

## 2021-07-17 NOTE — ED Provider Notes (Signed)
[facility] EMERGENCY DEPT Provider Note   CSN: [id number] Arrival date & time: 07/17/21  0859     History Chief Complaint  Patient presents with   Flank Pain    Denise Walsh is a 70 y.o. female.  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  Woke up with severe colicky pain at 4AM Associated nausea and vomiting. Pain located Left flank, no radiation to abdomen. Nausea improved with zofran pta.  Feels like she can't urinate but was able to not long before my evaluation. No fevers, cough, shorntess of breath, chest pain, dysuria, hematuria. Hx of kidney stones many years ago, does feel similar.  Not positional, nothing seems to [facility] it better or worse.     Past Medical History:  Diagnosis Date   Asthma    Fibroids    History of kidney stones    Ocular migraine    "one event many years ago"   Osteoarthritis    Osteoporosis    Right hand pain    Vitamin D deficiency     Patient Active Problem List   Diagnosis Date Noted   Focal dystonia 07/01/2021    Past Surgical History:  Procedure Laterality Date   BIOPSY BREAST Left    benign   CATARACT EXTRACTION Left    DILATION AND CURETTAGE OF UTERUS     x 2   FIBROID TUMOR REMOVED FROM UTERUS     x 3     OB History   No obstetric history on file.     Family History  Problem Relation Age of Onset   Hyperlipidemia Mother    Hypertension Mother    Congestive Heart Failure Mother    Dementia Mother    Cancer Mother        laryngeal   Alcoholism Mother    Hyperlipidemia Father    Hypertension Father    Dementia Father    Anxiety disorder Father     Social History   Tobacco Use   Smoking status: Former    Types: Cigarettes   Smokeless tobacco: Never  Substance Use Topics   Alcohol use: Not Currently   Drug use: Never    Home Medications Prior to  Admission medications   Medication Sig Start Date End Date Taking? Authorizing Provider  alendronate (FOSAMAX) 10 MG tablet Take 10 mg by mouth daily. 04/02/21  Yes [provider]  Biotin w/ Vitamins C & E (HAIR/SKIN/NAILS PO) Take 1 tablet by mouth daily.   Yes [provider]  loratadine (CLARITIN) 10 MG tablet Take 10 mg by mouth at bedtime.   Yes [provider]  Multiple Vitamin (MULTIVITAMIN) tablet Take 1 tablet by mouth daily.   Yes [provider]  vitamin C (ASCORBIC ACID) 500 MG tablet Take 500 mg by mouth daily.   Yes [provider]  VITAMIN D PO Take 1,000 Units by mouth daily.   Yes [provider]  albuterol (VENTOLIN HFA) 108 (90 Base) MCG/ACT inhaler Inhale 2 puffs into the lungs as needed. 03/13/21   [provider]  FLOVENT HFA 110 MCG/ACT inhaler Inhale 1 puff into the lungs as needed. 02/21/21   [provider]    Allergies    Montelukast sodium, Zithromax [azithromycin], Banana, Latex, and Codeine  Review of Systems   Review of Systems  Constitutional:  Negative for fever.  Respiratory:  Negative for cough and shortness of breath.   Cardiovascular:  Negative for chest pain.  Gastrointestinal:  Positive for nausea and vomiting. Negative for abdominal distention, constipation and diarrhea.  Genitourinary:  Positive for difficulty urinating (but then was able to). Negative for dysuria, frequency and hematuria.  Musculoskeletal:  Positive for back pain.  Skin:  Negative for rash.  Neurological:  Negative for weakness and numbness.   Physical Exam Updated Vital Signs BP (!) 95/46   Pulse 61   Temp 98 F (36.7 C) (Oral)   Resp 20   SpO2 100%   Physical Exam Vitals and nursing note reviewed.  Constitutional:      General: She is not in acute distress.    Appearance: She is well-developed. She is not diaphoretic.  HENT:     Head: Normocephalic and atraumatic.  Eyes:     Conjunctiva/sclera:  Conjunctivae normal.  Cardiovascular:     Rate and Rhythm: Normal rate and regular rhythm.     Heart sounds: Normal heart sounds. No murmur heard.   No friction rub. No gallop.  Pulmonary:     Effort: Pulmonary effort is normal. No respiratory distress.     Breath sounds: Normal breath sounds. No wheezing or rales.  Abdominal:     General: There is no distension.     Palpations: Abdomen is soft.     Tenderness: There is no abdominal tenderness. There is no guarding.  Musculoskeletal:        General: No tenderness.     Cervical back: Normal range of motion.  Skin:    General: Skin is warm and dry.     Findings: No erythema or rash.  Neurological:     Mental Status: She is alert and oriented to person, place, and time.    ED Results / Procedures / Treatments   Labs (all labs ordered are listed, but only abnormal results are displayed) Labs Reviewed  CBC WITH DIFFERENTIAL/PLATELET - Abnormal; Notable for the following components:      Result Value   Neutro Abs 8.4 (*)    All other components within normal limits  COMPREHENSIVE METABOLIC PANEL - Abnormal; Notable for the following components:   Glucose, Bld 110 (*)    All other components within normal limits  URINE CULTURE  LIPASE, BLOOD  URINALYSIS, ROUTINE W REFLEX MICROSCOPIC    EKG None  Radiology CT Renal Stone Study  Result Date: 07/17/2021 CLINICAL DATA:  Left flank pain this morning. EXAM: CT ABDOMEN AND PELVIS WITHOUT CONTRAST TECHNIQUE: Multidetector CT imaging of the abdomen and pelvis was performed following the standard protocol without IV contrast. COMPARISON:  None FINDINGS: Lower chest: The lung bases are clear of acute process. No pleural effusion or pulmonary lesions. The heart is normal in size. No pericardial effusion. The distal esophagus and aorta are unremarkable. Hepatobiliary: Simple right hepatic lobe cyst. No worrisome hepatic lesions without contrast. No intrahepatic biliary dilatation. The  gallbladder is unremarkable. No common bile duct dilatation. Pancreas: No mass, inflammation or ductal dilatation. Spleen: Normal size.  No focal lesions. Adrenals/Urinary Tract: The adrenal glands are unremarkable. Small bilateral renal calculi are noted. There is mild left-sided hydronephrosis and mild upper left hydroureter. No obstructing ureteral calculus is identified. Possible recently passed calculus. No bladder calculi. No right-sided ureteral calculi. No worrisome renal or bladder lesions without contrast. Stomach/Bowel: The stomach, duodenum, small bowel and colon are unremarkable. No acute inflammatory changes, mass lesions or obstructive findings. Vascular/Lymphatic: Scattered aortic and iliac  artery calcifications. No aneurysm. No mesenteric or retroperitoneal mass or adenopathy. Reproductive: The uterus and ovaries are unremarkable. Other: No pelvic mass or adenopathy. No free pelvic fluid collections. No inguinal mass or adenopathy. No abdominal wall hernia or subcutaneous lesions. Musculoskeletal: No significant bony findings. IMPRESSION: 1. Small bilateral renal calculi. 2. Mild left-sided hydronephrosis and mild upper left hydroureter. No obstructing ureteral calculus is identified. Possible recently passed calculus. 3. No worrisome renal or bladder lesions without contrast. 4. No acute abdominal/pelvic findings, mass lesions or adenopathy. 5. Aortic atherosclerosis. Aortic Atherosclerosis (ICD10-I70.0). Electronically Signed   By: Marijo Sanes M.D.   On: 07/17/2021 10:16    Procedures Procedures   Medications Ordered in ED Medications  fentaNYL (SUBLIMAZE) injection 50 mcg (50 mcg Intravenous Given 07/17/21 [id number])    ED Course  I have reviewed the triage vital signs and the nursing notes.  Pertinent labs & imaging results that were available during my care of the patient were reviewed by me and considered in my medical decision making (see chart for details).    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  DDx includes appendicitis, pancreatitis, cholecystitis, pyelonephritis, nephrolithiasis, diverticulitis, SBO, AAA.  Normal pulses, some urinary symptoms, doubt vascular pathology.    CT shows left hydronephrosis, ureter, no sign of stone. Symptoms improved---suspect likely recently passed kidney stone by history, exam, CT. UA without infection. Will give number for urology follow up, recommend hydration, return for new or worsening symptoms. Patient discharged in stable condition with understanding of reasons to return.    Final Clinical Impression(s) / ED Diagnoses Final diagnoses:  Hydronephrosis of left kidney  Nephrolithiasis    Rx / DC Orders ED Discharge Orders     None        Gareth Morgan, MD 07/18/21 1040

## 2021-07-17 NOTE — ED Notes (Signed)
Patient transported to CT 

## 2021-07-18 LAB — URINE CULTURE: Culture: NO GROWTH

## 2021-07-28 DIAGNOSIS — H2511 Age-related nuclear cataract, right eye: Secondary | ICD-10-CM | POA: Diagnosis not present

## 2021-07-28 DIAGNOSIS — H2513 Age-related nuclear cataract, bilateral: Secondary | ICD-10-CM | POA: Diagnosis not present

## 2021-07-28 DIAGNOSIS — H52201 Unspecified astigmatism, right eye: Secondary | ICD-10-CM | POA: Diagnosis not present

## 2021-08-08 DIAGNOSIS — I700 Atherosclerosis of aorta: Secondary | ICD-10-CM | POA: Diagnosis not present

## 2021-08-08 DIAGNOSIS — E7800 Pure hypercholesterolemia, unspecified: Secondary | ICD-10-CM | POA: Diagnosis not present

## 2021-09-17 ENCOUNTER — Encounter: Payer: Medicare Other | Admitting: Neurology

## 2021-11-05 DIAGNOSIS — E7800 Pure hypercholesterolemia, unspecified: Secondary | ICD-10-CM | POA: Diagnosis not present

## 2021-11-19 DIAGNOSIS — J029 Acute pharyngitis, unspecified: Secondary | ICD-10-CM | POA: Diagnosis not present

## 2021-11-19 DIAGNOSIS — J3489 Other specified disorders of nose and nasal sinuses: Secondary | ICD-10-CM | POA: Diagnosis not present

## 2021-11-19 DIAGNOSIS — J069 Acute upper respiratory infection, unspecified: Secondary | ICD-10-CM | POA: Diagnosis not present

## 2021-11-19 DIAGNOSIS — J4530 Mild persistent asthma, uncomplicated: Secondary | ICD-10-CM | POA: Diagnosis not present

## 2021-12-16 DIAGNOSIS — M79645 Pain in left finger(s): Secondary | ICD-10-CM | POA: Diagnosis not present

## 2022-01-16 ENCOUNTER — Other Ambulatory Visit: Payer: Self-pay | Admitting: Family Medicine

## 2022-03-03 DIAGNOSIS — E559 Vitamin D deficiency, unspecified: Secondary | ICD-10-CM | POA: Diagnosis not present

## 2022-03-03 DIAGNOSIS — E7800 Pure hypercholesterolemia, unspecified: Secondary | ICD-10-CM | POA: Diagnosis not present

## 2022-03-05 ENCOUNTER — Other Ambulatory Visit: Payer: Self-pay | Admitting: Family Medicine

## 2022-03-05 DIAGNOSIS — Z0000 Encounter for general adult medical examination without abnormal findings: Secondary | ICD-10-CM | POA: Diagnosis not present

## 2022-03-05 DIAGNOSIS — E7800 Pure hypercholesterolemia, unspecified: Secondary | ICD-10-CM | POA: Diagnosis not present

## 2022-03-05 DIAGNOSIS — Z1331 Encounter for screening for depression: Secondary | ICD-10-CM | POA: Diagnosis not present

## 2022-03-05 DIAGNOSIS — N200 Calculus of kidney: Secondary | ICD-10-CM

## 2022-03-05 DIAGNOSIS — M7662 Achilles tendinitis, left leg: Secondary | ICD-10-CM | POA: Diagnosis not present

## 2022-03-09 DIAGNOSIS — M810 Age-related osteoporosis without current pathological fracture: Secondary | ICD-10-CM | POA: Diagnosis not present

## 2022-03-09 DIAGNOSIS — Z780 Asymptomatic menopausal state: Secondary | ICD-10-CM | POA: Diagnosis not present

## 2022-03-16 DIAGNOSIS — M7662 Achilles tendinitis, left leg: Secondary | ICD-10-CM | POA: Diagnosis not present

## 2022-03-31 ENCOUNTER — Ambulatory Visit
Admission: RE | Admit: 2022-03-31 | Discharge: 2022-03-31 | Disposition: A | Discharge status: Home / Self Care | Payer: Medicare Other | Source: Ambulatory Visit | Attending: Family Medicine | Admitting: Family Medicine

## 2022-03-31 DIAGNOSIS — R109 Unspecified abdominal pain: Secondary | ICD-10-CM | POA: Diagnosis not present

## 2022-03-31 DIAGNOSIS — N200 Calculus of kidney: Secondary | ICD-10-CM | POA: Diagnosis not present

## 2022-04-01 DIAGNOSIS — R531 Weakness: Secondary | ICD-10-CM | POA: Diagnosis not present

## 2022-04-01 DIAGNOSIS — M7662 Achilles tendinitis, left leg: Secondary | ICD-10-CM | POA: Diagnosis not present

## 2022-04-01 DIAGNOSIS — M25572 Pain in left ankle and joints of left foot: Secondary | ICD-10-CM | POA: Diagnosis not present

## 2022-04-02 DIAGNOSIS — M810 Age-related osteoporosis without current pathological fracture: Secondary | ICD-10-CM | POA: Diagnosis not present

## 2022-04-02 DIAGNOSIS — Z87442 Personal history of urinary calculi: Secondary | ICD-10-CM | POA: Diagnosis not present

## 2022-04-02 DIAGNOSIS — K219 Gastro-esophageal reflux disease without esophagitis: Secondary | ICD-10-CM | POA: Diagnosis not present

## 2022-04-08 DIAGNOSIS — M25572 Pain in left ankle and joints of left foot: Secondary | ICD-10-CM | POA: Diagnosis not present

## 2022-04-08 DIAGNOSIS — R531 Weakness: Secondary | ICD-10-CM | POA: Diagnosis not present

## 2022-04-08 DIAGNOSIS — M7662 Achilles tendinitis, left leg: Secondary | ICD-10-CM | POA: Diagnosis not present

## 2022-04-09 ENCOUNTER — Encounter: Payer: Self-pay | Admitting: Gastroenterology

## 2022-04-10 DIAGNOSIS — M7662 Achilles tendinitis, left leg: Secondary | ICD-10-CM | POA: Diagnosis not present

## 2022-04-10 DIAGNOSIS — R531 Weakness: Secondary | ICD-10-CM | POA: Diagnosis not present

## 2022-04-10 DIAGNOSIS — M25572 Pain in left ankle and joints of left foot: Secondary | ICD-10-CM | POA: Diagnosis not present

## 2022-04-15 DIAGNOSIS — M7662 Achilles tendinitis, left leg: Secondary | ICD-10-CM | POA: Diagnosis not present

## 2022-04-15 DIAGNOSIS — R531 Weakness: Secondary | ICD-10-CM | POA: Diagnosis not present

## 2022-04-15 DIAGNOSIS — M25572 Pain in left ankle and joints of left foot: Secondary | ICD-10-CM | POA: Diagnosis not present

## 2022-04-17 DIAGNOSIS — R531 Weakness: Secondary | ICD-10-CM | POA: Diagnosis not present

## 2022-04-17 DIAGNOSIS — M25572 Pain in left ankle and joints of left foot: Secondary | ICD-10-CM | POA: Diagnosis not present

## 2022-04-17 DIAGNOSIS — M7662 Achilles tendinitis, left leg: Secondary | ICD-10-CM | POA: Diagnosis not present

## 2022-04-20 DIAGNOSIS — M7662 Achilles tendinitis, left leg: Secondary | ICD-10-CM | POA: Diagnosis not present

## 2022-04-20 DIAGNOSIS — Z1231 Encounter for screening mammogram for malignant neoplasm of breast: Secondary | ICD-10-CM | POA: Diagnosis not present

## 2022-04-27 DIAGNOSIS — R531 Weakness: Secondary | ICD-10-CM | POA: Diagnosis not present

## 2022-04-27 DIAGNOSIS — M7662 Achilles tendinitis, left leg: Secondary | ICD-10-CM | POA: Diagnosis not present

## 2022-04-27 DIAGNOSIS — M25572 Pain in left ankle and joints of left foot: Secondary | ICD-10-CM | POA: Diagnosis not present

## 2022-05-11 DIAGNOSIS — R531 Weakness: Secondary | ICD-10-CM | POA: Diagnosis not present

## 2022-05-11 DIAGNOSIS — M25572 Pain in left ankle and joints of left foot: Secondary | ICD-10-CM | POA: Diagnosis not present

## 2022-05-11 DIAGNOSIS — M7662 Achilles tendinitis, left leg: Secondary | ICD-10-CM | POA: Diagnosis not present

## 2022-05-13 DIAGNOSIS — R531 Weakness: Secondary | ICD-10-CM | POA: Diagnosis not present

## 2022-05-13 DIAGNOSIS — M7662 Achilles tendinitis, left leg: Secondary | ICD-10-CM | POA: Diagnosis not present

## 2022-05-13 DIAGNOSIS — M25572 Pain in left ankle and joints of left foot: Secondary | ICD-10-CM | POA: Diagnosis not present

## 2022-05-15 ENCOUNTER — Ambulatory Visit (AMBULATORY_SURGERY_CENTER): Payer: Self-pay | Admitting: *Deleted

## 2022-05-15 VITALS — Ht 63.0 in | Wt 138.2 lb

## 2022-05-15 DIAGNOSIS — Z1211 Encounter for screening for malignant neoplasm of colon: Secondary | ICD-10-CM

## 2022-05-15 NOTE — Progress Notes (Signed)
No egg or soy allergy known to patient  No issues known to pt with past sedation with any surgeries or procedures Patient denies ever being told they had issues or difficulty with intubation  No FH of Malignant Hyperthermia Pt is not on diet pills Pt is not on  home 02  Pt is not on blood thinners  Pt denies issues with constipation  No A fib or A flutter  PV completed in person. Pt verified name, DOB.  Procedure explained to pt. Prep instructions reviewed, questions answered. Pt encouraged to call with questions or issues.  If pt has My chart, procedure instructions sent via My Chart    

## 2022-05-20 DIAGNOSIS — R531 Weakness: Secondary | ICD-10-CM | POA: Diagnosis not present

## 2022-05-20 DIAGNOSIS — M25572 Pain in left ankle and joints of left foot: Secondary | ICD-10-CM | POA: Diagnosis not present

## 2022-05-20 DIAGNOSIS — M7662 Achilles tendinitis, left leg: Secondary | ICD-10-CM | POA: Diagnosis not present

## 2022-05-22 DIAGNOSIS — R531 Weakness: Secondary | ICD-10-CM | POA: Diagnosis not present

## 2022-05-22 DIAGNOSIS — M25572 Pain in left ankle and joints of left foot: Secondary | ICD-10-CM | POA: Diagnosis not present

## 2022-05-22 DIAGNOSIS — M7662 Achilles tendinitis, left leg: Secondary | ICD-10-CM | POA: Diagnosis not present

## 2022-05-27 DIAGNOSIS — M25572 Pain in left ankle and joints of left foot: Secondary | ICD-10-CM | POA: Diagnosis not present

## 2022-05-27 DIAGNOSIS — R531 Weakness: Secondary | ICD-10-CM | POA: Diagnosis not present

## 2022-05-27 DIAGNOSIS — M7662 Achilles tendinitis, left leg: Secondary | ICD-10-CM | POA: Diagnosis not present

## 2022-05-29 DIAGNOSIS — M7662 Achilles tendinitis, left leg: Secondary | ICD-10-CM | POA: Diagnosis not present

## 2022-05-29 DIAGNOSIS — M25572 Pain in left ankle and joints of left foot: Secondary | ICD-10-CM | POA: Diagnosis not present

## 2022-05-29 DIAGNOSIS — R531 Weakness: Secondary | ICD-10-CM | POA: Diagnosis not present

## 2022-06-02 ENCOUNTER — Encounter: Payer: Self-pay | Admitting: Gastroenterology

## 2022-06-05 ENCOUNTER — Encounter: Payer: Self-pay | Admitting: Gastroenterology

## 2022-06-05 ENCOUNTER — Ambulatory Visit (AMBULATORY_SURGERY_CENTER): Payer: Medicare Other | Admitting: Gastroenterology

## 2022-06-05 VITALS — BP 98/49 | HR 51 | Resp 10

## 2022-06-05 DIAGNOSIS — D123 Benign neoplasm of transverse colon: Secondary | ICD-10-CM

## 2022-06-05 DIAGNOSIS — Z1211 Encounter for screening for malignant neoplasm of colon: Secondary | ICD-10-CM | POA: Diagnosis not present

## 2022-06-05 DIAGNOSIS — D120 Benign neoplasm of cecum: Secondary | ICD-10-CM | POA: Diagnosis not present

## 2022-06-05 MED ORDER — SODIUM CHLORIDE 0.9 % IV SOLN
500.0000 mL | INTRAVENOUS | Status: DC
Start: 2022-06-05 — End: 2022-06-05

## 2022-06-05 NOTE — Op Note (Signed)
Denise Walsh: Denise Walsh Procedure Date: 06/05/2022 9:26 AM MRN: [id number] Endoscopist: Nicki Reaper E. Candis Schatz , MD Age: 71 Referring MD:  Date of Birth: 04/20/1951 Gender: Female Account #: [id number] Procedure:                Colonoscopy Indications:              Screening for colorectal malignant neoplasm (last                            colonoscopy was more than 10 years ago) Medicines:                Monitored Anesthesia Care Procedure:                Pre-Anesthesia Assessment:                           - Prior to the procedure, a History and Physical                            was performed, and patient medications and                            allergies were reviewed. The patient's tolerance of                            previous anesthesia was also reviewed. The risks                            and benefits of the procedure and the sedation                            options and risks were discussed with the patient.                            All questions were answered, and informed consent                            was obtained. Prior Anticoagulants: The patient has                            taken no previous anticoagulant or antiplatelet                            agents. ASA Grade Assessment: II - A patient with                            mild systemic disease. After reviewing the risks                            and benefits, the patient was deemed in                            satisfactory condition to undergo the procedure.  After obtaining informed consent, the colonoscope                            was passed under direct vision. Throughout the                            procedure, the patient's blood pressure, pulse, and                            oxygen saturations were monitored continuously. The                            CF [id number] #[id number] was introduced through the anus                            and advanced to  the the terminal ileum, with                            identification of the appendiceal orifice and IC                            valve. The colonoscopy was somewhat difficult due                            to a tortuous colon. Successful completion of the                            procedure was aided by using manual pressure. The                            patient tolerated the procedure well. The quality                            of the bowel preparation was adequate. The terminal                            ileum, ileocecal valve, appendiceal orifice, and                            rectum were photographed. The bowel preparation                            used was Miralax via split dose instruction. Scope In: 9:34:13 AM Scope Out: 9:52:33 AM Scope Withdrawal Time: 0 hours 11 minutes 0 seconds  Total Procedure Duration: 0 hours 18 minutes 20 seconds  Findings:                 The perianal and digital rectal examinations were                            normal. Pertinent negatives include normal                            sphincter tone and no  palpable rectal lesions.                           A 3 mm polyp was found in the cecum. The polyp was                            sessile. The polyp was removed with a cold snare.                            Resection and retrieval were complete. Estimated                            blood loss was minimal.                           A 3 mm polyp was found in the transverse colon. The                            polyp was sessile. The polyp was removed with a                            cold snare. Resection and retrieval were complete.                            Estimated blood loss was minimal.                           A few small-mouthed diverticula were found in the                            sigmoid colon.                           The exam was otherwise normal throughout the                            examined colon.                            The terminal ileum appeared normal.                           The retroflexed view of the distal rectum and anal                            verge was normal and showed no anal or rectal                            abnormalities. Complications:            No immediate complications. Estimated Blood Loss:     Estimated blood loss was minimal. Impression:               - One 3 mm polyp in the cecum, removed with a cold  snare. Resected and retrieved.                           - One 3 mm polyp in the transverse colon, removed                            with a cold snare. Resected and retrieved.                           - Diverticulosis in the sigmoid colon.                           - The examined portion of the ileum was normal.                           - The distal rectum and anal verge are normal on                            retroflexion view. Recommendation:           - Patient has a contact number available for                            emergencies. The signs and symptoms of potential                            delayed complications were discussed with the                            patient. Return to normal activities tomorrow.                            Written discharge instructions were provided to the                            patient.                           - Resume previous diet.                           - Continue present medications.                           - Await pathology results.                           - Repeat colonoscopy (date not yet determined) for                            surveillance based on pathology results. Chaunda Vandergriff E. Candis Schatz, MD 06/05/2022 9:59:15 AM This report has been signed electronically.

## 2022-06-05 NOTE — Progress Notes (Signed)
Report to PACU, RN, vss, BBS= Clear.  

## 2022-06-05 NOTE — Progress Notes (Signed)
Pt's states no medical or surgical changes since previsit or office visit. 

## 2022-06-05 NOTE — Progress Notes (Signed)
Called to room to assist during endoscopic procedure.  Patient ID and intended procedure confirmed with present staff. Received instructions for my participation in the procedure from the performing physician.  

## 2022-06-05 NOTE — Progress Notes (Signed)
[facility] History and Physical   Primary Care Physician:  Cari Caraway, MD   Reason for Procedure:   Colon cancer screening  Plan:    Screening colonoscopy     HPI: Denise Walsh is a 71 y.o. female undergoing average risk screening colonoscopy.  She has no family history of colon cancer and no chronic GI symptoms.   She reportedly had a colonoscopy 10+ years ago which was normal.   Past Medical History:  Diagnosis Date   Asthma    Fibroids    GERD (gastroesophageal reflux disease)    History of kidney stones    Hyperlipidemia    Ocular migraine    "one event many years ago"   Osteoarthritis    Osteoporosis    Right hand pain    Vitamin D deficiency     Past Surgical History:  Procedure Laterality Date   BIOPSY BREAST Left    benign   CATARACT EXTRACTION Left    DILATION AND CURETTAGE OF UTERUS     x 2   FIBROID TUMOR REMOVED FROM UTERUS     x 3    Prior to Admission medications   Medication Sig Start Date End Date Taking? Authorizing Provider  alendronate (FOSAMAX) 10 MG tablet Take 10 mg by mouth daily. 04/02/21  Yes [provider]  Biotin w/ Vitamins C & E (HAIR/SKIN/NAILS PO) Take 1 tablet by mouth daily.   Yes [provider]  loratadine (CLARITIN) 10 MG tablet Take 10 mg by mouth at bedtime.   Yes [provider]  Multiple Vitamin (MULTIVITAMIN) tablet Take 1 tablet by mouth daily.   Yes [provider]  rosuvastatin (CRESTOR) 5 MG tablet Take 5 mg by mouth daily. 02/02/22  Yes [provider]  vitamin C (ASCORBIC ACID) 500 MG tablet Take 500 mg by mouth daily.   Yes [provider]  VITAMIN D PO Take 1,000 Units by mouth daily.   Yes [provider]  albuterol (VENTOLIN HFA) 108 (90 Base) MCG/ACT inhaler Inhale 2 puffs into the lungs as needed. 03/13/21   [provider]  FLOVENT HFA 110 MCG/ACT inhaler Inhale 1 puff into the lungs as needed. 02/21/21   [provider]  mometasone (ELOCON) 0.1 % lotion 1 application 02/09/80   [provider]    Current Outpatient Medications  Medication Sig Dispense Refill   alendronate (FOSAMAX) 10 MG tablet Take 10 mg by mouth daily.     Biotin w/ Vitamins C & E (HAIR/SKIN/NAILS PO) Take 1 tablet by mouth daily.     loratadine (CLARITIN) 10 MG tablet Take 10 mg by mouth at bedtime.     Multiple Vitamin (MULTIVITAMIN) tablet Take 1 tablet by mouth daily.     rosuvastatin (CRESTOR) 5 MG tablet Take 5 mg by mouth daily.     vitamin C (ASCORBIC ACID) 500 MG tablet Take 500 mg by mouth daily.     VITAMIN D PO Take 1,000 Units by mouth daily.     albuterol (VENTOLIN HFA) 108 (90 Base) MCG/ACT inhaler Inhale 2 puffs into the lungs as needed.     FLOVENT HFA 110 MCG/ACT inhaler Inhale 1 puff into the lungs as needed.     mometasone (ELOCON) 0.1 % lotion 1 application     Current Facility-Administered Medications  Medication Dose Route Frequency Provider Last Rate Last Admin   0.9 %  sodium chloride infusion  500 mL Intravenous Continuous Daryel November, MD  Allergies as of 06/05/2022 - Review Complete 06/05/2022  Allergen Reaction Noted   Azithromycin Swelling and Hives 07/01/2021   Montelukast sodium Hives 02/24/2018   Banana Itching and Swelling 07/01/2021   Latex  07/01/2021   Codeine Nausea Only 02/24/2018    Family History  Problem Relation Age of Onset   Hyperlipidemia Mother    Hypertension Mother    Congestive Heart Failure Mother    Dementia Mother    Cancer Mother        laryngeal   Alcoholism Mother    Hyperlipidemia Father    Hypertension Father    Dementia Father    Anxiety disorder Father    Colon cancer Neg Hx    Colon polyps Neg Hx    Esophageal cancer Neg Hx    Stomach cancer Neg Hx    Rectal cancer Neg Hx     Social History   Socioeconomic History   Marital status: Married    Spouse name: Not on file   Number of children: 2   Years of education:  college   Highest education level: Not on file  Occupational History   Occupation: Engineer, maintenance (IT)  Tobacco Use   Smoking status: Former    Types: Cigarettes   Smokeless tobacco: Never  Vaping Use   Vaping Use: Never used  Substance and Sexual Activity   Alcohol use: Not Currently   Drug use: Never   Sexual activity: Not on file  Other Topics Concern   Not on file  Social History Narrative   Lives at home with husband.   Right-handed.   No daily caffeine per day.   Social Determinants of Health   Financial Resource Strain: Not on file  Food Insecurity: Not on file  Transportation Needs: Not on file  Physical Activity: Not on file  Stress: Not on file  Social Connections: Not on file  Intimate Partner Violence: Not on file    Review of Systems:  All other review of systems negative except as mentioned in the HPI.  Physical Exam: Vital signs There were no vitals taken for this visit.  General:   Alert,  Well-developed, well-nourished, pleasant and cooperative in NAD Airway:  Mallampati 3 Lungs:  Clear throughout to auscultation.   Heart:  Regular rate and rhythm; no murmurs, clicks, rubs,  or gallops. Abdomen:  Soft, nontender and nondistended. Normal bowel sounds.   Neuro/Psych:  Normal mood and affect. A and O x 3   Denise Walsh E. Candis Schatz, MD [facility]

## 2022-06-05 NOTE — Patient Instructions (Signed)
2 polyps removed- await pathology  Please read over handouts about polyps and diverticulosis  Please continue your normal medications   YOU HAD AN ENDOSCOPIC PROCEDURE TODAY AT [facility]:   Refer to the procedure report that was given to you for any specific questions about what was found during the examination.  If the procedure report does not answer your questions, please call your gastroenterologist to clarify.  If you requested that your care partner not be given the details of your procedure findings, then the procedure report has been included in a sealed envelope for you to review at your convenience later.  YOU SHOULD EXPECT: Some feelings of bloating in the abdomen. Passage of more gas than usual.  Walking can help get rid of the air that was put into your GI tract during the procedure and reduce the bloating. If you had a lower endoscopy (such as a colonoscopy or flexible sigmoidoscopy) you may notice spotting of blood in your stool or on the toilet paper. If you underwent a bowel prep for your procedure, you may not have a normal bowel movement for a few days.  Please Note:  You might notice some irritation and congestion in your nose or some drainage.  This is from the oxygen used during your procedure.  There is no need for concern and it should clear up in a day or so.  SYMPTOMS TO REPORT IMMEDIATELY:  Following lower endoscopy (colonoscopy or flexible sigmoidoscopy):  Excessive amounts of blood in the stool  Significant tenderness or worsening of abdominal pains  Swelling of the abdomen that is new, acute  Fever of 100F or higher  For urgent or emergent issues, a gastroenterologist can be reached at any hour by calling [phone]. Do not use MyChart messaging for urgent concerns.    DIET:  We do recommend a small meal at first, but then you may proceed to your regular diet.  Drink plenty of fluids but you should avoid alcoholic beverages for 24  hours.  ACTIVITY:  You should plan to take it easy for the rest of today and you should NOT DRIVE or use heavy machinery until tomorrow (because of the sedation medicines used during the test).    FOLLOW UP: Our staff will call the number listed on your records the next business day following your procedure.  We will call around 7:15- 8:00 am to check on you and address any questions or concerns that you may have regarding the information given to you following your procedure. If we do not reach you, we will leave a message.  If you develop any symptoms (ie: fever, flu-like symptoms, shortness of breath, cough etc.) before then, please call [phone].  If you test positive for Covid 19 in the 2 weeks post procedure, please call and report this information to [place].    If any biopsies were taken you will be contacted by phone or by letter within the next 1-3 weeks.  Please call us at [phone] if you have not heard about the biopsies in 3 weeks.    SIGNATURES/CONFIDENTIALITY: You and/or your care partner have signed paperwork which will be entered into your electronic medical record.  These signatures attest to the fact that that the information above on your After Visit Summary has been reviewed and is understood.  Full responsibility of the confidentiality of this discharge information lies with you and/or your care-partner.

## 2022-06-08 ENCOUNTER — Telehealth: Payer: Self-pay

## 2022-06-08 NOTE — Telephone Encounter (Signed)
No answer, left message to call if having any issues or concerns, B.Ivey Nembhard RN 

## 2022-06-14 NOTE — Progress Notes (Signed)
Denise Walsh, The two polyps which I removed during your recent procedure were proven to be completely benign but are considered "pre-cancerous" polyps that MAY have grown into cancer if they had not been removed.  Studies shows that at least 20% of women over age 69 and 30% of men over age 69 have pre-cancerous polyps.  Based on current nationally recognized surveillance guidelines, I recommend that you have a repeat colonoscopy in 7 years.    If you develop any new rectal bleeding, abdominal pain or significant bowel habit changes, please contact me before then.

## 2022-08-03 IMAGING — CT CT CARDIAC CORONARY ARTERY CALCIUM SCORE
3 series · 14 of 20 positions shown, 16 images · non-contrast
Comparison: None.

CLINICAL DATA: 70-year-old Caucasian female with history of
hyperlipidemia and family history of heart disease. Former smoking
history.

EXAM:
CT CARDIAC CORONARY ARTERY CALCIUM SCORE
TECHNIQUE: Non-contrast imaging through the heart was performed using
prospective ECG gating. Image post processing was performed on an
independent workstation, allowing for quantitative analysis of the
heart and coronary arteries. Note that this exam targets the heart
and the chest was not imaged in its entirety.

[Series 2: calcium scoring 2.00 qr36 bestdiast 69% hrt calciu · axial · 0.42mm/px · z∈[+1658,+1730]mm · 4 of 60 slices shown]
[im 12/60  vessel]
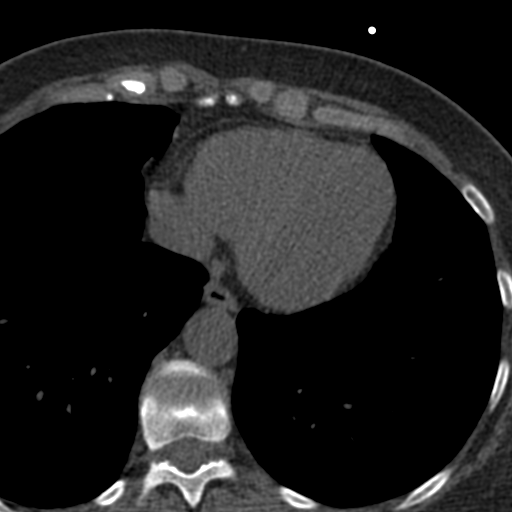
[im 24/60  vessel]
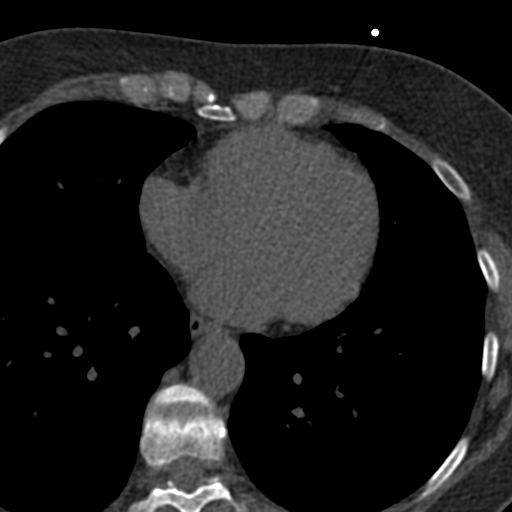
[im 36/60  vessel]
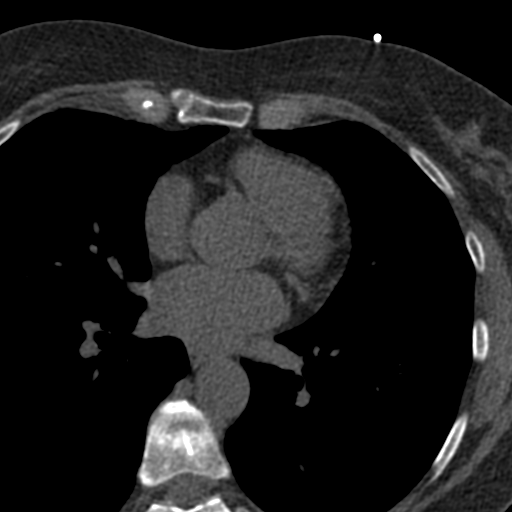
[im 48/60  vessel]
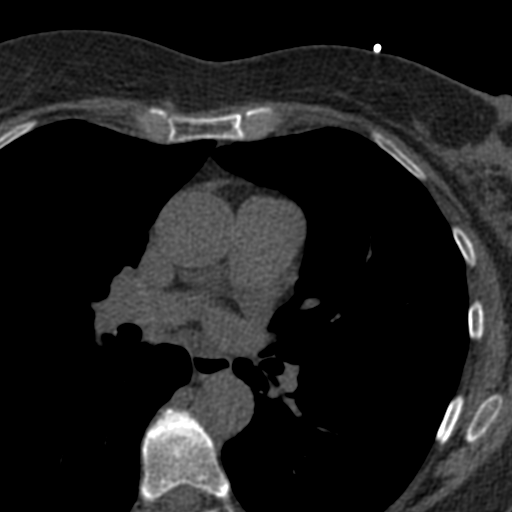

[Series 3: calcium scoring 2.00 br40 bestdiast 69% axial · axial · 0.59mm/px · z∈[+1654,+1734]mm · 5 of 60 slices shown, 7 images]
[im 10/60  vessel]
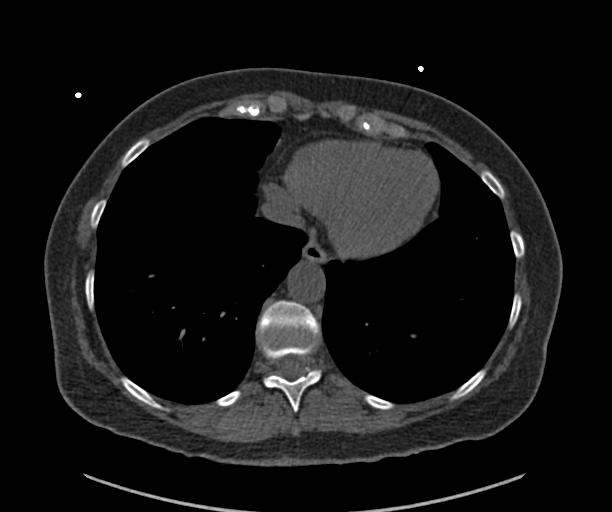
[im 10/60  lung]
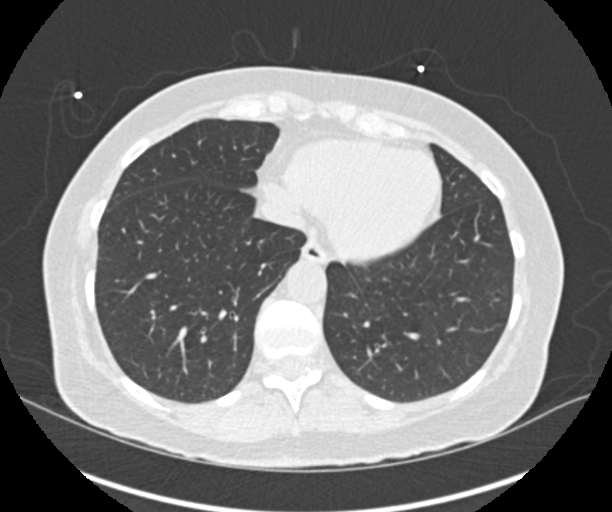
[im 20/60  vessel]
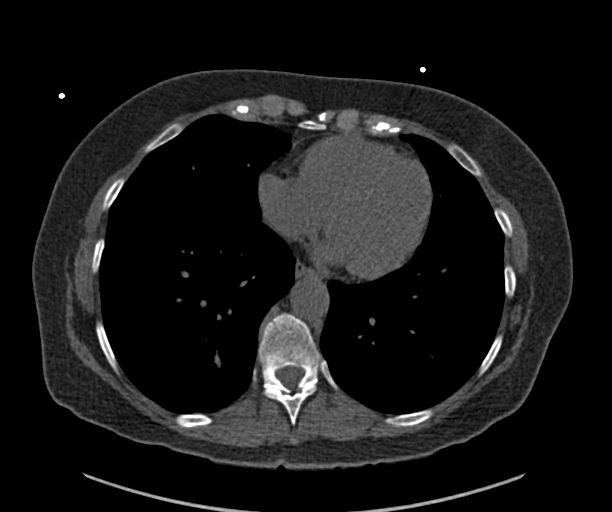
[im 30/60  vessel]
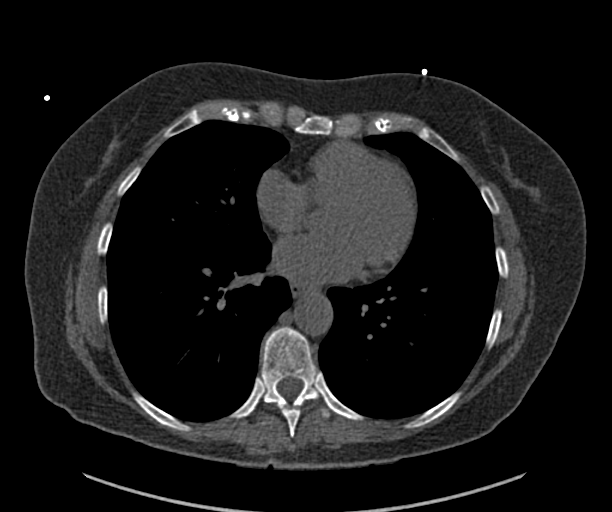
[im 40/60  vessel]
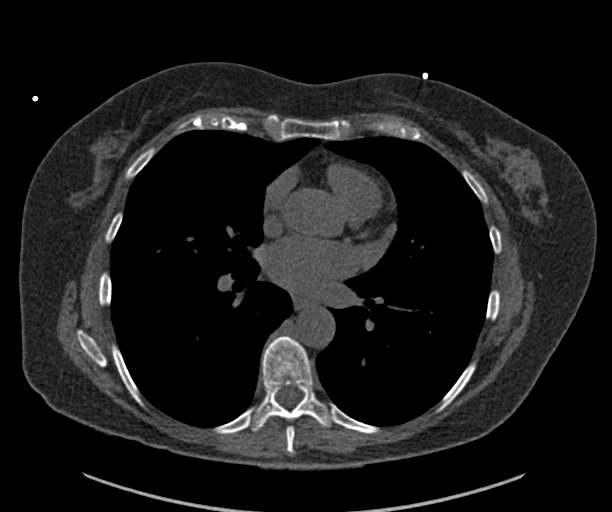
[im 50/60  vessel]
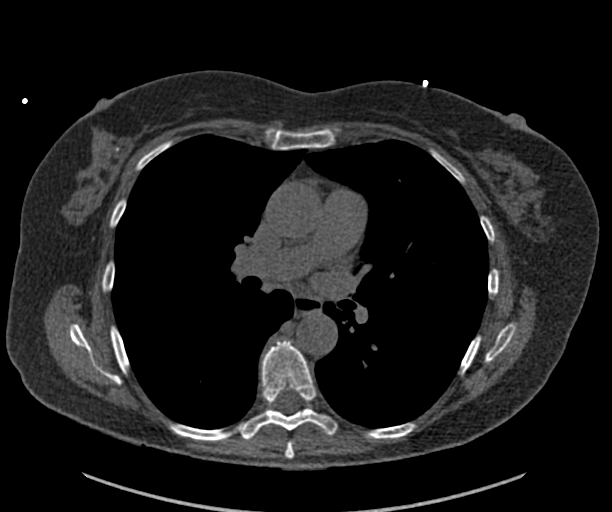
[im 50/60  lung]
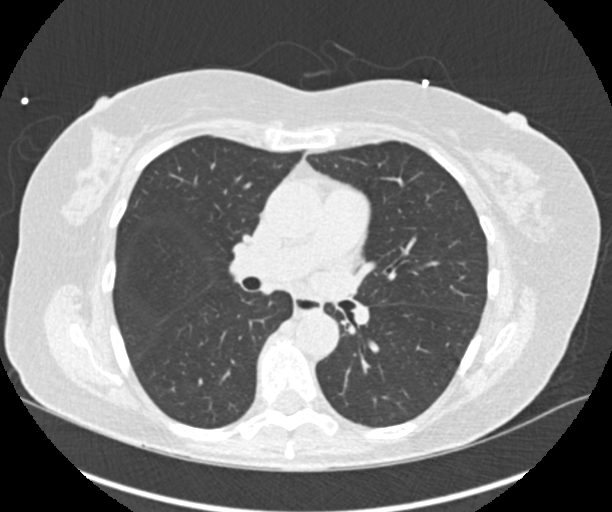

[Series 9: calcium scoring 2.00 br60 bestdiast 69% lungs · axial · 0.59mm/px · z∈[+1654,+1734]mm · 5 of 60 slices shown]
[im 10/60  vessel]
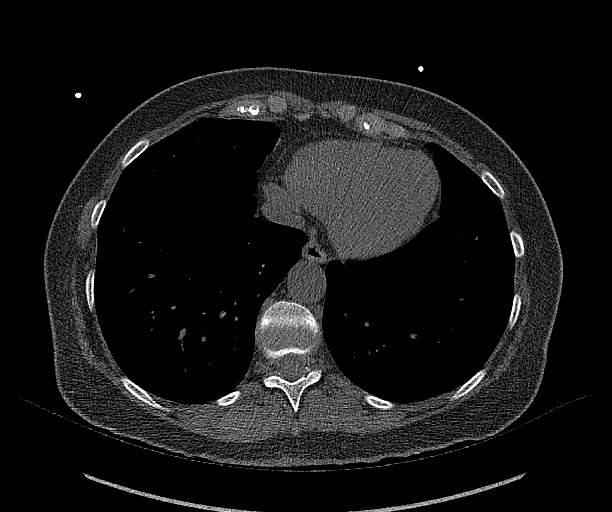
[im 20/60  vessel]
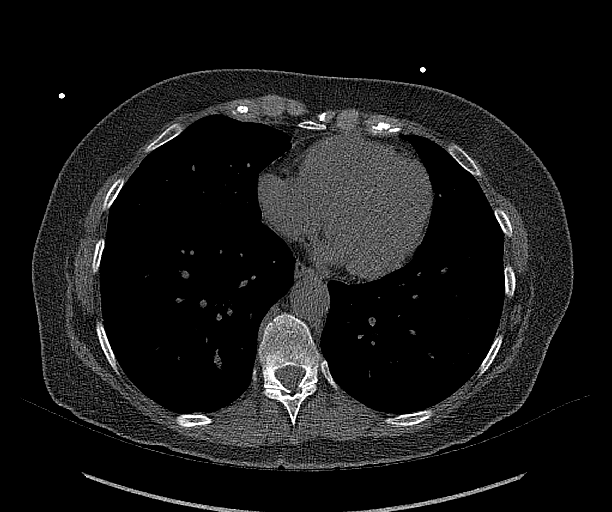
[im 30/60  vessel]
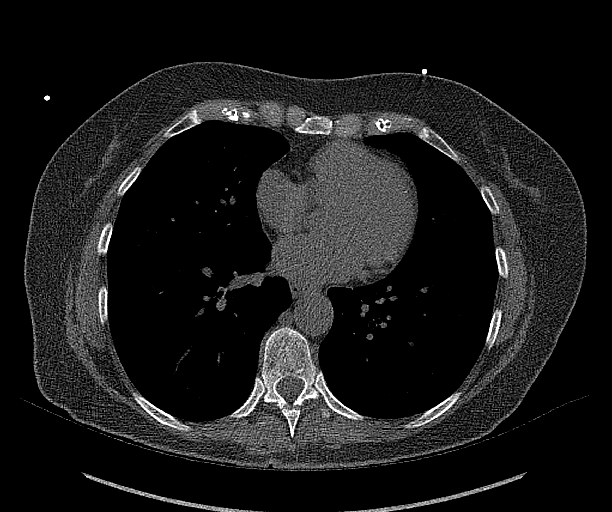
[im 40/60  vessel]
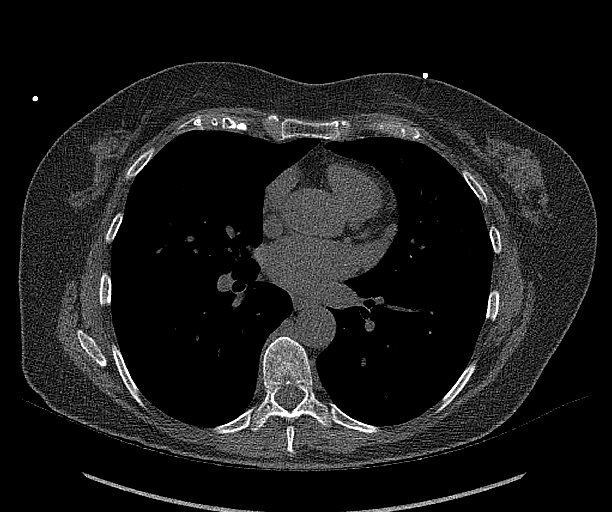
[im 50/60  vessel]
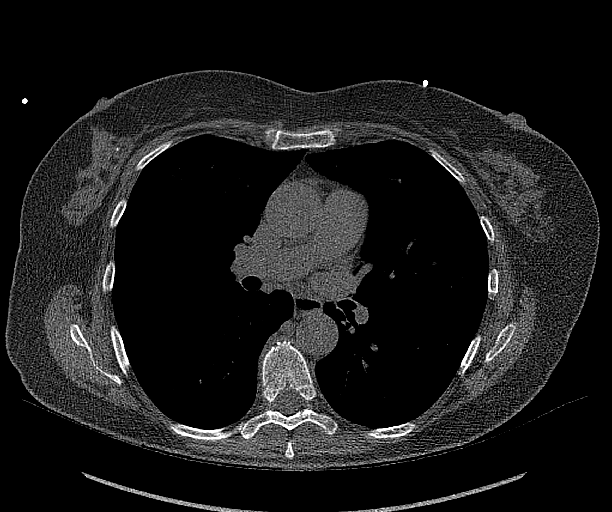

[14 of 20 positions shown; findings below may reference images not displayed]

FINDINGS: CORONARY CALCIUM SCORES:

Left Main: 0

LAD: 0

LCx: 0

RCA: 0

Total Agatston Score: 0

[HOSPITAL] percentile: 0

AORTA MEASUREMENTS:

Ascending Aorta: 30 mm

Descending Aorta: 23 mm

OTHER FINDINGS:

The heart size is within normal limits. No pericardial fluid
identified. Visualized segments of the thoracic aorta and central
pulmonary arteries are normal in caliber. Visualized mediastinum and
hilar regions demonstrate no lymphadenopathy or masses. Visualized
lungs show no evidence of pulmonary edema, consolidation,
pneumothorax, nodule or pleural fluid. Visualized upper abdomen and
bony structures are unremarkable.
IMPRESSION: Coronary calcium score of 0.

## 2022-11-04 DIAGNOSIS — S92302A Fracture of unspecified metatarsal bone(s), left foot, initial encounter for closed fracture: Secondary | ICD-10-CM | POA: Diagnosis not present

## 2022-11-22 DIAGNOSIS — H524 Presbyopia: Secondary | ICD-10-CM | POA: Diagnosis not present

## 2022-11-27 DIAGNOSIS — S92302A Fracture of unspecified metatarsal bone(s), left foot, initial encounter for closed fracture: Secondary | ICD-10-CM | POA: Diagnosis not present

## 2023-03-08 DIAGNOSIS — H26492 Other secondary cataract, left eye: Secondary | ICD-10-CM | POA: Diagnosis not present

## 2023-03-10 DIAGNOSIS — M810 Age-related osteoporosis without current pathological fracture: Secondary | ICD-10-CM | POA: Diagnosis not present

## 2023-03-10 DIAGNOSIS — Z0000 Encounter for general adult medical examination without abnormal findings: Secondary | ICD-10-CM | POA: Diagnosis not present

## 2023-03-10 DIAGNOSIS — E7800 Pure hypercholesterolemia, unspecified: Secondary | ICD-10-CM | POA: Diagnosis not present

## 2023-03-16 DIAGNOSIS — Z6825 Body mass index (BMI) 25.0-25.9, adult: Secondary | ICD-10-CM | POA: Diagnosis not present

## 2023-03-16 DIAGNOSIS — J0140 Acute pansinusitis, unspecified: Secondary | ICD-10-CM | POA: Diagnosis not present

## 2023-03-16 DIAGNOSIS — J209 Acute bronchitis, unspecified: Secondary | ICD-10-CM | POA: Diagnosis not present

## 2023-03-17 DIAGNOSIS — E782 Mixed hyperlipidemia: Secondary | ICD-10-CM | POA: Diagnosis not present

## 2023-03-17 DIAGNOSIS — M810 Age-related osteoporosis without current pathological fracture: Secondary | ICD-10-CM | POA: Diagnosis not present

## 2023-03-17 DIAGNOSIS — J4521 Mild intermittent asthma with (acute) exacerbation: Secondary | ICD-10-CM | POA: Diagnosis not present

## 2023-03-17 DIAGNOSIS — I700 Atherosclerosis of aorta: Secondary | ICD-10-CM | POA: Diagnosis not present

## 2023-04-12 DIAGNOSIS — K219 Gastro-esophageal reflux disease without esophagitis: Secondary | ICD-10-CM | POA: Diagnosis not present

## 2023-04-12 DIAGNOSIS — M810 Age-related osteoporosis without current pathological fracture: Secondary | ICD-10-CM | POA: Diagnosis not present

## 2023-04-14 DIAGNOSIS — K080 Exfoliation of teeth due to systemic causes: Secondary | ICD-10-CM | POA: Diagnosis not present

## 2023-04-22 DIAGNOSIS — Z1231 Encounter for screening mammogram for malignant neoplasm of breast: Secondary | ICD-10-CM | POA: Diagnosis not present

## 2023-05-25 DIAGNOSIS — H26492 Other secondary cataract, left eye: Secondary | ICD-10-CM | POA: Diagnosis not present

## 2023-05-25 DIAGNOSIS — Z961 Presence of intraocular lens: Secondary | ICD-10-CM | POA: Diagnosis not present

## 2023-05-25 DIAGNOSIS — H02831 Dermatochalasis of right upper eyelid: Secondary | ICD-10-CM | POA: Diagnosis not present

## 2023-05-25 DIAGNOSIS — H18413 Arcus senilis, bilateral: Secondary | ICD-10-CM | POA: Diagnosis not present

## 2023-05-25 DIAGNOSIS — H26493 Other secondary cataract, bilateral: Secondary | ICD-10-CM | POA: Diagnosis not present

## 2023-06-22 DIAGNOSIS — H26491 Other secondary cataract, right eye: Secondary | ICD-10-CM | POA: Diagnosis not present

## 2023-07-26 IMAGING — CT CT ABD-PELV W/O CM
2 of 4 series · 11 of 46 positions shown, 12 images · non-contrast
Comparison: CT 07/17/2021

CLINICAL DATA: Left flank pain.  Nephrolithiasis.



[Series 2: routine abdomen pelvis without 5.00 br40 s3 axial · axial · non-contrast · 0.50mm/px · z∈[+1106,+1421]mm · 8 of 77 slices shown, 9 images]
[im 7/77  soft-tissue]
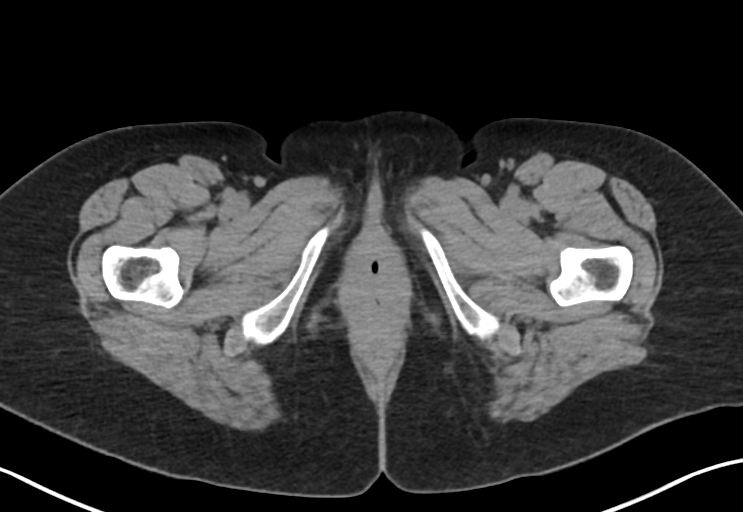
[im 7/77  bone]
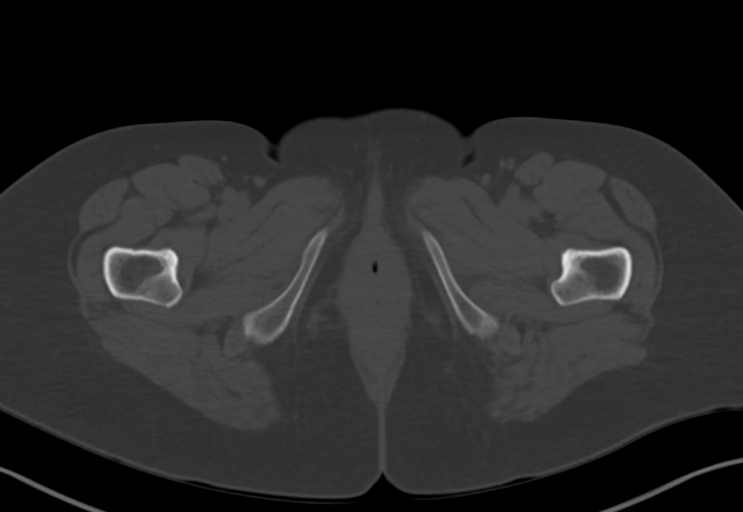
[im 14/77  soft-tissue]
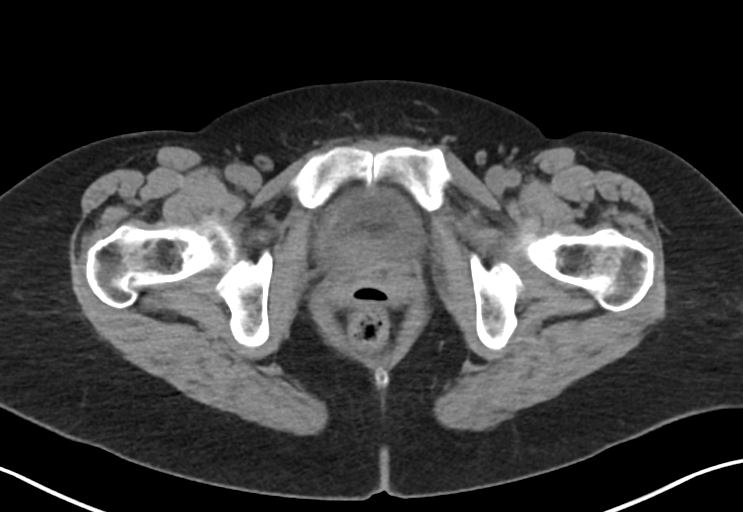
[im 25/77  soft-tissue]
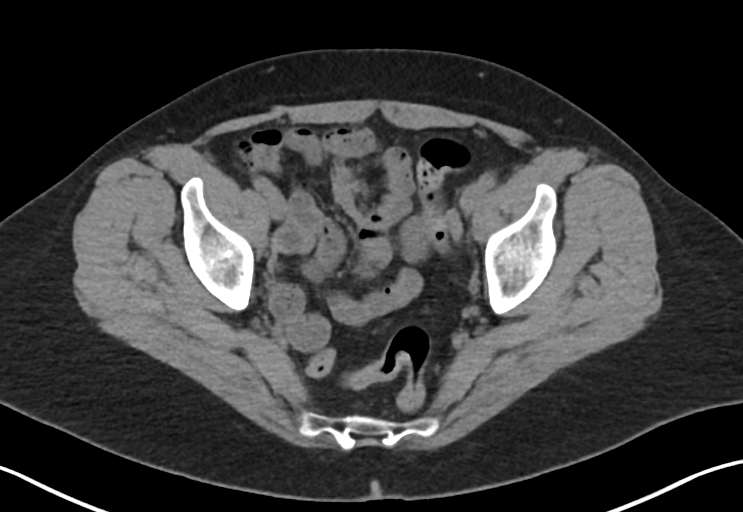
[im 35/77  soft-tissue]
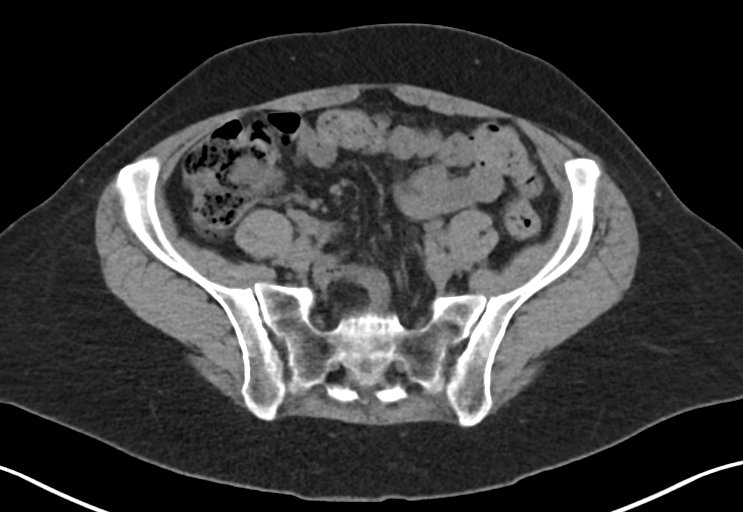
[im 42/77  soft-tissue]
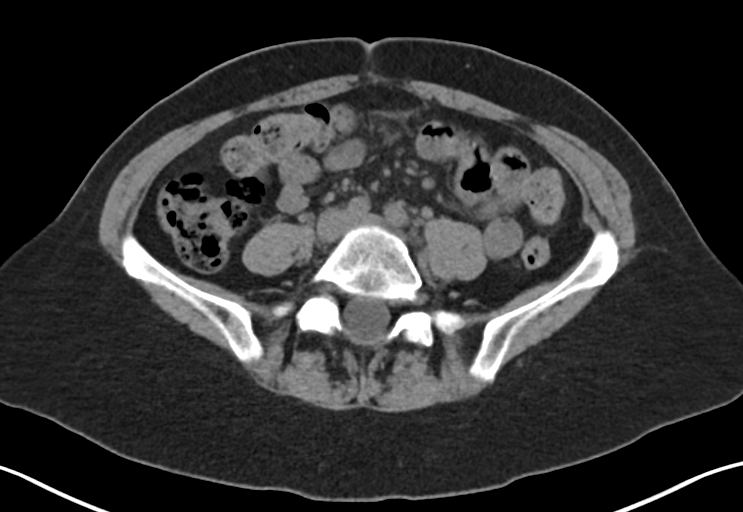
[im 52/77  soft-tissue]
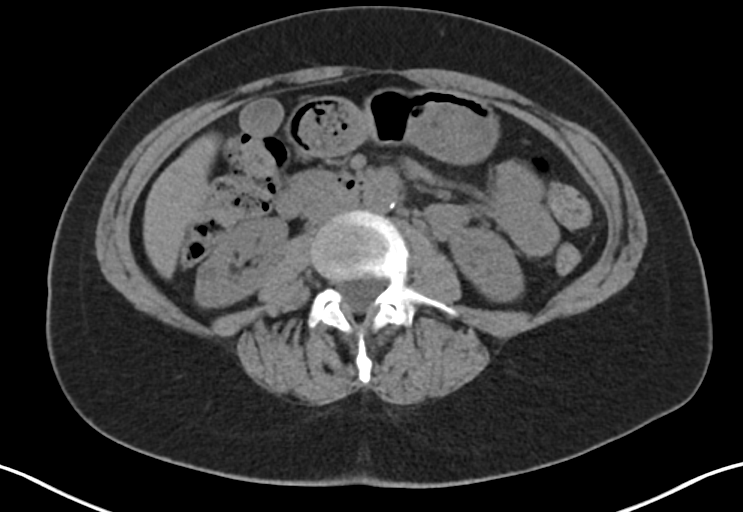
[im 63/77  soft-tissue]
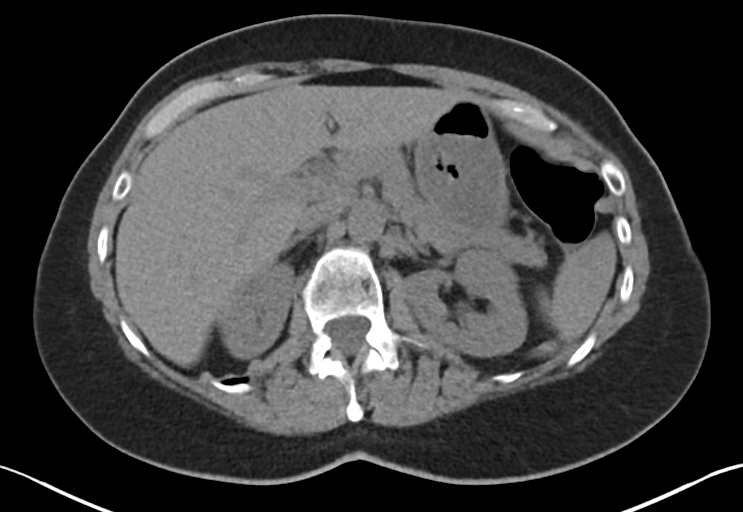
[im 70/77  soft-tissue]
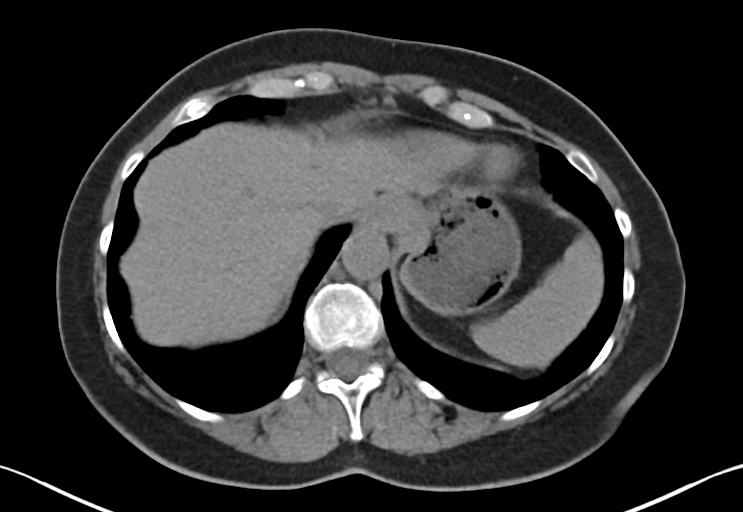

[Series 4: routine abdomen pelvis without 2.00 br40 s3 cor · coronal · non-contrast · 0.73mm/px · 3 of 128 slices shown]
[im 43/128  soft-tissue]
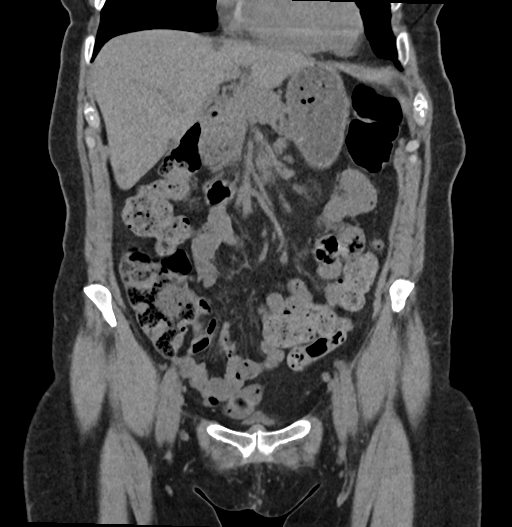
[im 57/128  soft-tissue]
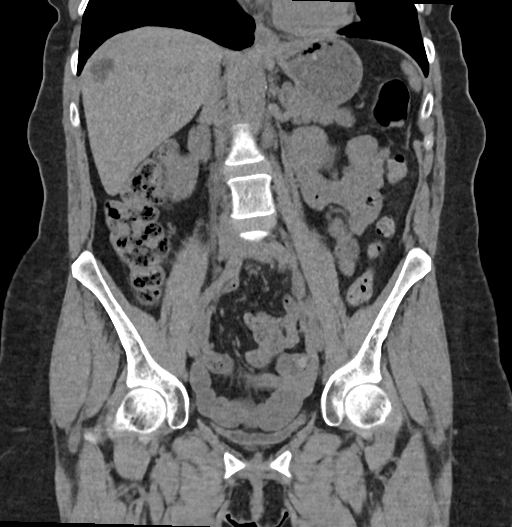
[im 71/128  soft-tissue]
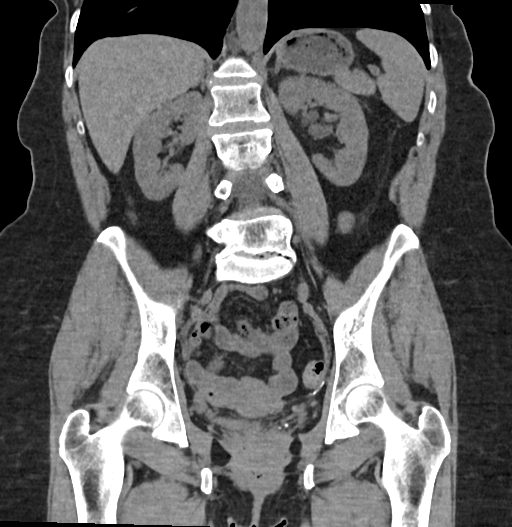

[11 of 46 positions shown; findings below may reference images not displayed]

FINDINGS: Lower chest: Clear lung bases.  Normal heart size.

Hepatobiliary: Stable cyst in the right hepatic lobe, benign needing
no further follow-up. Hepatic hypodensities are stable, likely
representing additional small cysts, but incompletely characterized.
Gallbladder physiologically distended, no calcified stone. No
biliary dilatation.

Pancreas: No ductal dilatation or inflammation. No evidence of
pancreatic mass on this unenhanced exam.

Spleen: Normal in size without focal abnormality.

Adrenals/Urinary Tract: Normal adrenal glands.

Minimal left hydronephrosis. There is no ureteral dilatation. No
ureteral stone or cause for obstruction. Two nonobstructing
intrarenal stones, larger measuring 3 mm in the lower pole. No right
hydronephrosis or hydroureter. No right-sided urolithiasis, previous
right-sided renal stone is not seen. There is no perinephric edema.
Probable small cyst in the lower right kidney, incompletely
characterized on this unenhanced exam, needing no further follow-up.
Urinary bladder is essentially empty, not well evaluated. No bladder
stone.

Stomach/Bowel: Ingested material within the stomach. There is no
small bowel obstruction or inflammation. Portions of normal appendix
are visualized, there is no appendicitis. Small to moderate colonic
stool burden. There is no colonic inflammation or pericolonic edema.

Vascular/Lymphatic: Mild aortic atherosclerosis. No aortic aneurysm.
No abdominopelvic adenopathy.

Reproductive: Uterus and bilateral adnexa are unremarkable.

Other: Pelvic calcifications consistent with phleboliths, all of
which appear outside the course of the ureters. No ascites. Tiny fat
containing umbilical hernia.

Musculoskeletal: Mild L5-S1 degenerative disc disease with vacuum
phenomenon. There are no acute or suspicious osseous abnormalities.
IMPRESSION: 1. Minimal left hydronephrosis without obstructing stone or cause
for obstruction. This may be secondary to recently passed stone.
Mild hydronephrosis was also seen on prior exam without obstructing
stone, consider direct visualization with retrograde urography
versus CT urogram without and with IV contrast for assessment of the
renal collecting system as clinically indicated.
2. Nonobstructing left intrarenal calculi.

Aortic Atherosclerosis ([id number]).

## 2023-10-20 DIAGNOSIS — K080 Exfoliation of teeth due to systemic causes: Secondary | ICD-10-CM | POA: Diagnosis not present

## 2023-11-08 DIAGNOSIS — J069 Acute upper respiratory infection, unspecified: Secondary | ICD-10-CM | POA: Diagnosis not present

## 2023-12-03 DIAGNOSIS — K080 Exfoliation of teeth due to systemic causes: Secondary | ICD-10-CM | POA: Diagnosis not present

## 2024-01-07 DIAGNOSIS — J3489 Other specified disorders of nose and nasal sinuses: Secondary | ICD-10-CM | POA: Diagnosis not present

## 2024-01-18 ENCOUNTER — Other Ambulatory Visit: Payer: Self-pay | Admitting: Family Medicine

## 2024-01-18 DIAGNOSIS — M810 Age-related osteoporosis without current pathological fracture: Secondary | ICD-10-CM | POA: Diagnosis not present

## 2024-01-18 DIAGNOSIS — R29810 Facial weakness: Secondary | ICD-10-CM | POA: Diagnosis not present

## 2024-01-18 DIAGNOSIS — E782 Mixed hyperlipidemia: Secondary | ICD-10-CM | POA: Diagnosis not present

## 2024-01-18 DIAGNOSIS — R030 Elevated blood-pressure reading, without diagnosis of hypertension: Secondary | ICD-10-CM | POA: Diagnosis not present

## 2024-01-19 ENCOUNTER — Encounter (HOSPITAL_COMMUNITY): Payer: Self-pay | Admitting: Family Medicine

## 2024-01-19 ENCOUNTER — Other Ambulatory Visit (HOSPITAL_COMMUNITY): Payer: Self-pay | Admitting: Family Medicine

## 2024-01-19 DIAGNOSIS — R29810 Facial weakness: Secondary | ICD-10-CM

## 2024-01-20 ENCOUNTER — Ambulatory Visit (HOSPITAL_COMMUNITY)
Admission: RE | Admit: 2024-01-20 | Discharge: 2024-01-20 | Disposition: A | Discharge status: Home / Self Care | Source: Ambulatory Visit | Attending: Cardiology | Admitting: Cardiology

## 2024-01-20 DIAGNOSIS — R29810 Facial weakness: Secondary | ICD-10-CM | POA: Insufficient documentation

## 2024-02-20 ENCOUNTER — Ambulatory Visit
Admission: RE | Admit: 2024-02-20 | Discharge: 2024-02-20 | Disposition: A | Discharge status: Home / Self Care | Source: Ambulatory Visit | Attending: Family Medicine | Admitting: Family Medicine

## 2024-02-20 DIAGNOSIS — R29810 Facial weakness: Secondary | ICD-10-CM | POA: Diagnosis not present

## 2024-02-20 MED ORDER — GADOPICLENOL 0.5 MMOL/ML IV SOLN
6.0000 mL | Freq: Once | INTRAVENOUS | Status: AC | PRN
  Administered 2024-02-20: 6 mL via INTRAVENOUS

## 2024-03-09 DIAGNOSIS — F419 Anxiety disorder, unspecified: Secondary | ICD-10-CM | POA: Diagnosis not present

## 2024-03-09 DIAGNOSIS — J4521 Mild intermittent asthma with (acute) exacerbation: Secondary | ICD-10-CM | POA: Diagnosis not present

## 2024-03-09 DIAGNOSIS — M810 Age-related osteoporosis without current pathological fracture: Secondary | ICD-10-CM | POA: Diagnosis not present

## 2024-03-09 DIAGNOSIS — E782 Mixed hyperlipidemia: Secondary | ICD-10-CM | POA: Diagnosis not present

## 2024-03-09 DIAGNOSIS — K5900 Constipation, unspecified: Secondary | ICD-10-CM | POA: Diagnosis not present

## 2024-03-10 DIAGNOSIS — Z1331 Encounter for screening for depression: Secondary | ICD-10-CM | POA: Diagnosis not present

## 2024-03-10 DIAGNOSIS — Z0000 Encounter for general adult medical examination without abnormal findings: Secondary | ICD-10-CM | POA: Diagnosis not present

## 2024-03-17 DIAGNOSIS — Z8262 Family history of osteoporosis: Secondary | ICD-10-CM | POA: Diagnosis not present

## 2024-03-17 DIAGNOSIS — Z7952 Long term (current) use of systemic steroids: Secondary | ICD-10-CM | POA: Diagnosis not present

## 2024-03-17 DIAGNOSIS — M8588 Other specified disorders of bone density and structure, other site: Secondary | ICD-10-CM | POA: Diagnosis not present

## 2024-04-13 DIAGNOSIS — E242 Drug-induced Cushing's syndrome: Secondary | ICD-10-CM | POA: Diagnosis not present

## 2024-04-13 DIAGNOSIS — Z8781 Personal history of (healed) traumatic fracture: Secondary | ICD-10-CM | POA: Diagnosis not present

## 2024-04-13 DIAGNOSIS — Z87442 Personal history of urinary calculi: Secondary | ICD-10-CM | POA: Diagnosis not present

## 2024-04-13 DIAGNOSIS — M810 Age-related osteoporosis without current pathological fracture: Secondary | ICD-10-CM | POA: Diagnosis not present

## 2024-04-26 DIAGNOSIS — K080 Exfoliation of teeth due to systemic causes: Secondary | ICD-10-CM | POA: Diagnosis not present

## 2024-04-27 DIAGNOSIS — Z1231 Encounter for screening mammogram for malignant neoplasm of breast: Secondary | ICD-10-CM | POA: Diagnosis not present

## 2024-05-12 DIAGNOSIS — Z6826 Body mass index (BMI) 26.0-26.9, adult: Secondary | ICD-10-CM | POA: Diagnosis not present

## 2024-05-12 DIAGNOSIS — D229 Melanocytic nevi, unspecified: Secondary | ICD-10-CM | POA: Diagnosis not present

## 2024-05-15 DIAGNOSIS — D239 Other benign neoplasm of skin, unspecified: Secondary | ICD-10-CM | POA: Diagnosis not present

## 2024-05-15 DIAGNOSIS — L449 Papulosquamous disorder, unspecified: Secondary | ICD-10-CM | POA: Diagnosis not present
